# Patient Record
Sex: Female | Born: 1955
Health system: Southern US, Community
[De-identification: ages and names within clinical notes are randomized; demographics above are authoritative.]

## PROBLEM LIST (undated history)

## (undated) DIAGNOSIS — B829 Intestinal parasitism, unspecified: Secondary | ICD-10-CM

## (undated) DIAGNOSIS — S0300XA Dislocation of jaw, unspecified side, initial encounter: Secondary | ICD-10-CM

## (undated) HISTORY — PX: TUBAL LIGATION: SHX77

---

## 2017-03-31 ENCOUNTER — Emergency Department: Payer: 59

## 2017-03-31 ENCOUNTER — Emergency Department
Admission: EM | Admit: 2017-03-31 | Discharge: 2017-03-31 | Disposition: A | Discharge status: Home / Self Care | Payer: 59 | Attending: Emergency Medicine | Admitting: Emergency Medicine

## 2017-03-31 ENCOUNTER — Encounter: Payer: Self-pay | Admitting: Emergency Medicine

## 2017-03-31 DIAGNOSIS — Y9389 Activity, other specified: Secondary | ICD-10-CM | POA: Diagnosis not present

## 2017-03-31 DIAGNOSIS — M26629 Arthralgia of temporomandibular joint, unspecified side: Secondary | ICD-10-CM

## 2017-03-31 DIAGNOSIS — Y929 Unspecified place or not applicable: Secondary | ICD-10-CM | POA: Insufficient documentation

## 2017-03-31 DIAGNOSIS — Y998 Other external cause status: Secondary | ICD-10-CM | POA: Insufficient documentation

## 2017-03-31 DIAGNOSIS — R6884 Jaw pain: Secondary | ICD-10-CM | POA: Insufficient documentation

## 2017-03-31 DIAGNOSIS — S0340XA Sprain of jaw, unspecified side, initial encounter: Secondary | ICD-10-CM | POA: Diagnosis not present

## 2017-03-31 DIAGNOSIS — S0993XA Unspecified injury of face, initial encounter: Secondary | ICD-10-CM | POA: Diagnosis present

## 2017-03-31 DIAGNOSIS — X58XXXA Exposure to other specified factors, initial encounter: Secondary | ICD-10-CM | POA: Diagnosis not present

## 2017-03-31 MED ORDER — CYCLOBENZAPRINE HCL 10 MG PO TABS: 10.0000 mg | ORAL_TABLET | Freq: Three times a day (TID) | ORAL | 0 refills | Status: DC | PRN

## 2017-03-31 MED ORDER — KETOROLAC TROMETHAMINE 10 MG PO TABS: 10.0000 mg | ORAL_TABLET | Freq: Four times a day (QID) | ORAL | 0 refills | Status: AC | PRN

## 2017-03-31 MED ORDER — KETOROLAC TROMETHAMINE 30 MG/ML IJ SOLN
30.0000 mg | Freq: Once | INTRAMUSCULAR | Status: AC
  Administered 2017-03-31: 30 mg via INTRAMUSCULAR
  Filled 2017-03-31: qty 1

## 2017-03-31 MED ORDER — DIAZEPAM 5 MG PO TABS
5.0000 mg | ORAL_TABLET | Freq: Once | ORAL | Status: AC
  Administered 2017-03-31: 5 mg via ORAL
  Filled 2017-03-31: qty 1

## 2017-03-31 NOTE — ED Notes (Signed)
Upon assessment pt reports pain to jaw after biting jaw breaker.

## 2017-03-31 NOTE — Discharge Instructions (Signed)
Follow-up with her dentist is a history unable to schedule an appointment. Inquire about a referral to a TMJ specialist. Take medication as prescribed. If symptoms worsen do not hesitate to return to emergency department.

## 2017-03-31 NOTE — ED Provider Notes (Signed)
[facility] Emergency Department Provider Note   ____________________________________________   I have reviewed the triage vital signs and the nursing notes.   HISTORY  Chief Complaint Jaw Pain    HPI Lauren Pennington is a 61 y.o. female present or department with right TMJ pain after biting through a hard piece of candy yesterday. Patient noted while biting to the candy an audible pop and since of subluxation of the TMJ joint. Since the incident she is experience severe TMJ joint pain, popping and sense of instability of the right TMJ joint. Patient has been unable to chew and bite food since the incident. Patient reports pain radiates into the ear over the right eye and along the right temporal area of the scalp. Patient reports a history of TMJ issues that have been minor and intermittent in the past. Patient denies fever, chills, headache, vision changes, chest pain, chest tightness, shortness of breath, abdominal pain, nausea and vomiting.  History reviewed. No pertinent past medical history.  There are no active problems to display for this patient.   History reviewed. No pertinent surgical history.  Prior to Admission medications   Medication Sig Start Date End Date Taking? Authorizing Provider  cyclobenzaprine (FLEXERIL) 10 MG tablet Take 1 tablet (10 mg total) by mouth 3 (three) times daily as needed for muscle spasms. 03/31/17   Little, Traci M, PA-C  ketorolac (TORADOL) 10 MG tablet Take 1 tablet (10 mg total) by mouth every 6 (six) hours as needed. 03/31/17 04/05/17  Little, Jordan Likes, PA-C    Allergies Patient has no known allergies.  History reviewed. No pertinent family history.  Social History Social History  Substance Use Topics  . Smoking status: Never Smoker  . Smokeless tobacco: Never Used  . Alcohol use No    Review of Systems Constitutional: Negative for fever/chills Eyes: No visual changes. ENT:  Negative for sore throat and for  difficulty swallowing Cardiovascular: Denies chest pain. Respiratory: Denies cough. Denies shortness of breath. Gastrointestinal: No abdominal pain.  No nausea, vomiting, diarrhea. Genitourinary: Negative for dysuria. Musculoskeletal: Positive for TMJ pain. Skin: Negative for rash. Neurological: Negative for headaches.  Negative focal weakness or numbness. Negative for loss of consciousness. Able to ambulate. ____________________________________________   PHYSICAL EXAM:  VITAL SIGNS: ED Triage Vitals  Enc Vitals Group     BP 03/31/17 2004 114/71     Pulse Rate 03/31/17 2004 74     Resp 03/31/17 2004 18     Temp 03/31/17 2004 98 F (36.7 C)     Temp Source 03/31/17 2004 Oral     SpO2 03/31/17 2004 98 %     Weight 03/31/17 2005 13 lb (5.897 kg)     Height 03/31/17 2005  (1.626 m)     Head Circumference --      Peak Flow --      Pain Score 03/31/17 2004 10     Pain Loc --      Pain Edu? --      Excl. in GC? --     Constitutional: Alert and oriented. Well appearing and in no acute distress.  Eyes: Conjunctivae are normal. PERRL. EOMI  Head: Normocephalic and atraumatic. ENT:      Ears: Canals clear. TMs intact bilaterally.      Nose: No congestion/rhinnorhea.      Mouth/Throat: Mucous membranes are moist.  Neck:Supple. No thyromegaly. No stridor.  Cardiovascular: Normal rate, regular rhythm. Normal S1 and S2.  Good peripheral circulation.  Respiratory: Normal respiratory effort without tachypnea or retractions. Lungs CTAB. No wheezes/rales/rhonchi. Good air entry to the bases with no decreased or absent breath sounds. Hematological/Lymphatic/Immunological: No cervical lymphadenopathy. Cardiovascular: Normal rate, regular rhythm. Normal distal pulses. Gastrointestinal: Bowel sounds 4 quadrants. Soft and nontender to palpation.  Musculoskeletal: Right side TMJ pain and palpable popping. Pain with maximal opening and closing. Mandibular deviation with popping/crepitus over  the right TMJ. Unable to fully clench with occlusion. Palpable muscle tension and tenderness over the right temporalis, masseter. Nontender with normal range of motion in all extremities. Neurologic: Normal speech and language. No gross focal neurologic deficits are appreciated. No gait instability. Cranial nerves: II-X intact. No sensory loss or abnormal reflexes.  Skin:  Skin is warm, dry and intact. No rash noted. Psychiatric: Mood and affect are normal. Speech and behavior are normal. Patient exhibits appropriate insight and judgement.  ____________________________________________   LABS (all labs ordered are listed, but only abnormal results are displayed)  Labs Reviewed - No data to display ____________________________________________  EKG none ____________________________________________  RADIOLOGY DG TMJ Open & Close Unilateral Right FINDINGS: Technically limited examination due to overlapping bone structures. No evidence of dislocation or displacement of the temporomandibular joints on the AP view. Open and closed mouth images of the right temporomandibular joint demonstrate minimal motion of the mandibular head with respect to the joint space. No focal bone lesion or bone destruction is appreciated.  IMPRESSION: Limited study. No evidence of dislocation of the right temporomandibular joint. Limited motion is demonstrated between open and closed-mouth views. This is nonspecific in could be due to positioning, muscle spasm, or joint dysfunction.  Imaging review, no acute fracture or dislocation noted per report.  ____________________________________________   PROCEDURES  Procedure(s) performed: no    Critical Care performed: no ____________________________________________   INITIAL IMPRESSION / ASSESSMENT AND PLAN / ED COURSE  Pertinent labs & imaging results that were available during my care of the patient were reviewed by me and considered in my medical  decision making (see chart for details).  Patient presents to emergency department with right-sided TMJ pain.Marland Kitchen History, physical exam findings, and imaging are reassuring symptoms are consistent with right side TMJ sprain. Patient responded well Toradol and Valium given during the course of care in the emergency department. Patient will be prescribed 5 day course of Toradol for pain and inflammation and flexeril for muscle spasms as needed. Patient advised to follow up with her dentist and/or TMJ specialist or return to the emergency department if symptoms return or worsen. Patient informed of clinical course, understand medical decision-making process, and agree with plan.  ____________________________________________   FINAL CLINICAL IMPRESSION(S) / ED DIAGNOSES  Final diagnoses:  Temporomandibular joint (TMJ) pain  TMJ (sprain of temporomandibular joint), initial encounter       NEW MEDICATIONS STARTED DURING THIS VISIT:  Discharge Medication List as of 03/31/2017 10:55 PM    START taking these medications   Details  cyclobenzaprine (FLEXERIL) 10 MG tablet Take 1 tablet (10 mg total) by mouth 3 (three) times daily as needed for muscle spasms., Starting Wed 03/31/2017, Print    ketorolac (TORADOL) 10 MG tablet Take 1 tablet (10 mg total) by mouth every 6 (six) hours as needed., Starting Wed 03/31/2017, Until Mon 04/05/2017, Print         Note:  This document was prepared using Dragon voice recognition software and may include unintentional dictation errors.    Little, Karl Pock 04/01/17 0020    Merrily Brittle, MD 04/02/17  1059  

## 2017-03-31 NOTE — ED Triage Notes (Signed)
Pt presents to ED with c/o right sided jaw pain after biting a gobstopper last night. Pt reports right sided jaw tenderness with radiating pain into her right ear and right eye. Pt is worried she popped her jaw out of place and states she is able to feel popping when she puts her hand over her jaw when trying to eat or open her mouth.

## 2017-05-19 ENCOUNTER — Emergency Department
Admission: EM | Admit: 2017-05-19 | Discharge: 2017-05-19 | Disposition: A | Discharge status: Home / Self Care | Payer: 59 | Attending: Emergency Medicine | Admitting: Emergency Medicine

## 2017-05-19 ENCOUNTER — Encounter: Payer: Self-pay | Admitting: *Deleted

## 2017-05-19 DIAGNOSIS — H9201 Otalgia, right ear: Secondary | ICD-10-CM | POA: Insufficient documentation

## 2017-05-19 DIAGNOSIS — Z79899 Other long term (current) drug therapy: Secondary | ICD-10-CM | POA: Insufficient documentation

## 2017-05-19 DIAGNOSIS — H6121 Impacted cerumen, right ear: Secondary | ICD-10-CM | POA: Insufficient documentation

## 2017-05-19 MED ORDER — DEXAMETHASONE SODIUM PHOSPHATE 10 MG/ML IJ SOLN
10.0000 mg | Freq: Once | INTRAMUSCULAR | Status: AC
  Administered 2017-05-19: 10 mg via INTRAMUSCULAR
  Filled 2017-05-19: qty 1

## 2017-05-19 MED ORDER — CARBAMIDE PEROXIDE 6.5 % OT SOLN: 5.0000 [drp] | Freq: Two times a day (BID) | OTIC | 0 refills | Status: DC

## 2017-05-19 NOTE — ED Provider Notes (Signed)
[facility] Emergency Department Provider Note   ____________________________________________   First MD Initiated Contact with Patient 05/19/17 1452     (approximate)  I have reviewed the triage vital signs and the nursing notes.   HISTORY  Chief Complaint Otalgia    HPI Lauren Pennington is a 61 y.o. female patient complaining of one month of right ear fullness/pain and decreased hearing. Patient denies nasal congestion or other URI signs symptoms. Patient also complaining of mild right jaw pain secondary to a TMJ condition.Patient rates her pain discomfort as a 10 over 10. No palliative measures for complaint.   History reviewed. No pertinent past medical history.  There are no active problems to display for this patient.   History reviewed. No pertinent surgical history.  Prior to Admission medications   Medication Sig Start Date End Date Taking? Authorizing Provider  carbamide peroxide (DEBROX) 6.5 % OTIC solution Place 5 drops 2 (two) times daily into the right ear. 05/19/17   Joni ReiningSmith, Ronald K, PA-C  cyclobenzaprine (FLEXERIL) 10 MG tablet Take 1 tablet (10 mg total) by mouth 3 (three) times daily as needed for muscle spasms. 03/31/17   Little, Traci M, PA-C    Allergies Patient has no known allergies.  History reviewed. No pertinent family history.  Social History Social History   Tobacco Use  . Smoking status: Never Smoker  . Smokeless tobacco: Never Used  Substance Use Topics  . Alcohol use: No  . Drug use: No    Review of Systems Constitutional: No fever/chills Eyes: No visual changes. ENT: No sore throat. Right ear pain. Mild right jaw pain Cardiovascular: Denies chest pain. Respiratory: Denies shortness of breath. Gastrointestinal: No abdominal pain.  No nausea, no vomiting.  No diarrhea.  No constipation. Genitourinary: Negative for dysuria. Musculoskeletal: Negative for back pain. Skin: Negative for rash. Neurological:  Negative for headaches, focal weakness or numbness.   ____________________________________________   PHYSICAL EXAM:  VITAL SIGNS: ED Triage Vitals [05/19/17 1437]  Enc Vitals Group     BP 119/79     Pulse Rate 96     Resp 18     Temp 97.8 F (36.6 C)     Temp Source Oral     SpO2 97 %     Weight 129 lb (58.5 kg)     Height 5\' 4"  (1.626 m)     Head Circumference      Peak Flow      Pain Score 10     Pain Loc      Pain Edu?      Excl. in GC?     Constitutional: Alert and oriented. Well appearing and in no acute distress. Eyes: Conjunctivae are normal. PERRL. EOMI. Head: Atraumatic. Nose: No congestion/rhinnorhea. EARS: Impaction right ear Mouth/Throat: Mucous membranes are moist.  Oropharynx non-erythematous. Neck: No stridor.   Hematological/Lymphatic/Immunilogical: No cervical lymphadenopathy. Cardiovascular: Normal rate, regular rhythm. Grossly normal heart sounds.  Good peripheral circulation. Respiratory: Normal respiratory effort.  No retractions. Lungs CTAB. Neurologic:  Normal speech and language. No gross focal neurologic deficits are appreciated. No gait instability. Skin:  Skin is warm, dry and intact. No rash noted. Psychiatric: Mood and affect are normal. Speech and behavior are normal.  ____________________________________________   LABS (all labs ordered are listed, but only abnormal results are displayed)  Labs Reviewed - No data to display ____________________________________________  EKG   ____________________________________________  RADIOLOGY  No results found.  ____________________________________________   PROCEDURES  Procedure(s) performed:  Procedures  Critical Care performed: No  ____________________________________________   INITIAL IMPRESSION / ASSESSMENT AND PLAN / ED COURSE  As part of my medical decision making, I reviewed the following data within the electronic MEDICAL RECORD NUMBER    Patient presented right ear  pain and decreased hearing secondary to wax pole. Patient here was irrigated using hydroperoxide and warm saline. Status post irrigation patient reports hearing has returned. Patient get a prescription for earwax softener and advised to follow-up with PCP if condition recurs.      ____________________________________________   FINAL CLINICAL IMPRESSION(S) / ED DIAGNOSES  Final diagnoses:  Impacted cerumen of right ear  Otalgia of right ear     ED Discharge Orders        Ordered    carbamide peroxide (DEBROX) 6.5 % OTIC solution  2 times daily     05/19/17 1538       Note:  This document was prepared using Dragon voice recognition software and may include unintentional dictation errors.    Joni ReiningSmith, Ronald K, PA-C 05/19/17 1544    Emily FilbertWilliams, Jonathan E, MD 05/20/17 [phone]

## 2017-05-19 NOTE — ED Notes (Signed)
Pt was seen in ED 4-5 weeks ago and dx with TMJ - pt then went to dentist and was dx with TMJ - about 3 weeks ago she yawned and her jaw popped and since then she has a "screaching" noise in her right ear with occ pain

## 2017-05-19 NOTE — ED Triage Notes (Signed)
States right ear pain and fullness, awake and alert in no acute distress

## 2017-07-01 ENCOUNTER — Emergency Department: Payer: 59

## 2017-07-01 ENCOUNTER — Emergency Department
Admission: EM | Admit: 2017-07-01 | Discharge: 2017-07-01 | Disposition: A | Discharge status: Home / Self Care | Payer: 59 | Attending: Emergency Medicine | Admitting: Emergency Medicine

## 2017-07-01 ENCOUNTER — Other Ambulatory Visit: Payer: Self-pay

## 2017-07-01 DIAGNOSIS — M5412 Radiculopathy, cervical region: Secondary | ICD-10-CM | POA: Insufficient documentation

## 2017-07-01 DIAGNOSIS — M79601 Pain in right arm: Secondary | ICD-10-CM | POA: Diagnosis present

## 2017-07-01 DIAGNOSIS — Z79899 Other long term (current) drug therapy: Secondary | ICD-10-CM | POA: Diagnosis not present

## 2017-07-01 LAB — CBC
HEMATOCRIT: 41.4 % (ref 35.0–47.0)
HEMOGLOBIN: 14.2 g/dL (ref 12.0–16.0)
MCH: 31.4 pg (ref 26.0–34.0)
MCHC: 34.3 g/dL (ref 32.0–36.0)
MCV: 91.6 fL (ref 80.0–100.0)
Platelets: 255 10*3/uL (ref 150–440)
RBC: 4.52 MIL/uL (ref 3.80–5.20)
RDW: 13.4 % (ref 11.5–14.5)
WBC: 7.8 10*3/uL (ref 3.6–11.0)

## 2017-07-01 LAB — BASIC METABOLIC PANEL
ANION GAP: 9 (ref 5–15)
BUN: 12 mg/dL (ref 6–20)
CO2: 26 mmol/L (ref 22–32)
Calcium: 9.4 mg/dL (ref 8.9–10.3)
Chloride: 102 mmol/L (ref 101–111)
Creatinine, Ser: 0.57 mg/dL (ref 0.44–1.00)
GFR calc Af Amer: >60 mL/min (ref >60)
Glucose, Bld: 89 mg/dL (ref 65–99)
POTASSIUM: 4 mmol/L (ref 3.5–5.1)
SODIUM: 137 mmol/L (ref 135–145)

## 2017-07-01 LAB — TROPONIN I: Troponin I: 0.03 ng/mL (ref <0.03)

## 2017-07-01 MED ORDER — IBUPROFEN 600 MG PO TABS
600.0000 mg | ORAL_TABLET | Freq: Once | ORAL | Status: AC
  Administered 2017-07-01: 600 mg via ORAL
  Filled 2017-07-01: qty 1

## 2017-07-01 MED ORDER — PREDNISONE 20 MG PO TABS: ORAL_TABLET | ORAL | 0 refills | Status: AC

## 2017-07-01 MED ORDER — PREDNISONE 20 MG PO TABS
60.0000 mg | ORAL_TABLET | Freq: Once | ORAL | Status: AC
  Administered 2017-07-01: 60 mg via ORAL
  Filled 2017-07-01: qty 3

## 2017-07-01 MED ORDER — OXYCODONE-ACETAMINOPHEN 5-325 MG PO TABS: 1.0000 | ORAL_TABLET | Freq: Four times a day (QID) | ORAL | 0 refills | Status: AC | PRN

## 2017-07-01 MED ORDER — OXYCODONE-ACETAMINOPHEN 5-325 MG PO TABS
1.0000 | ORAL_TABLET | Freq: Once | ORAL | Status: AC
  Administered 2017-07-01: 1 via ORAL
  Filled 2017-07-01: qty 1

## 2017-07-01 NOTE — ED Notes (Signed)
ED Provider at bedside. 

## 2017-07-01 NOTE — ED Triage Notes (Signed)
Pt states she thinks she has a pinched nerve. States CP, R arm pain, and upper back pain. States symptoms began Sunday. Alert oriented ambulatory. States nausea. States she recently started on naprosyn for pain.

## 2017-07-01 NOTE — Discharge Instructions (Signed)
You have a bulging disc in your lower neck at the T1 level which is likely causing most of your symptoms.  Continue to take the Naprosyn as prescribed, and you can add on the Percocet for more severe pain.  You should also start the prednisone at home tomorrow, and continue as prescribed, finishing the full course.  Return to the emergency department for new or worsening pain, new or worsening weakness or numbness, weakness or numbness spreading to other areas, drooping in your face, confusion or change in mental status, vision changes, or any other new or worsening symptoms that concern you.  You can follow-up with the neurosurgeon in 1-2 weeks.

## 2017-07-01 NOTE — ED Notes (Signed)
Report off to allison rn  

## 2017-07-01 NOTE — ED Provider Notes (Addendum)
[facility] Emergency Department Provider Note ____________________________________________   First MD Initiated Contact with Patient 07/01/17 1507     (approximate)  I have reviewed the triage vital signs and the nursing notes.   HISTORY  Chief Complaint Chest Pain and Arm Pain (right )    HPI Lauren Pennington is a 61 y.o. female with past medical history as noted below who presents with primarily right arm pain over the last 5 days, relatively acute onset, ranging from the neck to the right upper back, into the right chest, and down the right arm, and associated with numbness to her inner arm and intermittent tingling.  Patient states it is aggravated by lifting and carrying things.  She states she has decreased grip strength on the right side.  Patient denies any prior history of this symptom.  She denies any headache, or any numbness or weakness in other parts of her body.  She states that she was seen 4 days ago in the emergency department at [facility] and told she had a pinched nerve.  She states she has been on Naprosyn and Flexeril without relief.  History reviewed. No pertinent past medical history.  There are no active problems to display for this patient.   Past Surgical History:  Procedure Laterality Date  . TUBAL LIGATION      Prior to Admission medications   Medication Sig Start Date End Date Taking? Authorizing Provider  carbamide peroxide (DEBROX) 6.5 % OTIC solution Place 5 drops 2 (two) times daily into the right ear. 05/19/17  Yes Joni Reining, PA-C  cyclobenzaprine (FLEXERIL) 10 MG tablet Take 1 tablet (10 mg total) by mouth 3 (three) times daily as needed for muscle spasms. 03/31/17  Yes Little, Traci M, PA-C  ibuprofen (ADVIL,MOTRIN) 800 MG tablet Take 1 tablet by mouth 3 (three) times daily. 04/16/17  Yes [provider]  naproxen (NAPROSYN) 500 MG tablet Take 1 tablet by mouth 2 (two) times daily. 06/28/17  Yes [provider]  oxyCODONE-acetaminophen (ROXICET) 5-325 MG tablet Take 1 tablet by mouth every 6 (six) hours as needed for up to 5 days for severe pain. 07/01/17 07/06/17  Dionne Bucy, MD  predniSONE (DELTASONE) 20 MG tablet Take 3 tablets (60 mg total) by mouth daily with breakfast for 5 days, THEN 2 tablets (40 mg total) daily with breakfast for 3 days, THEN 1 tablet (20 mg total) daily with breakfast for 3 days. 07/02/17 07/13/17  Dionne Bucy, MD    Allergies Patient has no known allergies.  History reviewed. No pertinent family history.  Social History Social History   Tobacco Use  . Smoking status: Never Smoker  . Smokeless tobacco: Never Used  Substance Use Topics  . Alcohol use: Yes  . Drug use: No    Review of Systems  Constitutional: No fever. Eyes: No visual changes. ENT: Positive for neck pain. Cardiovascular: Positive for chest pain. Respiratory: Denies shortness of breath. Gastrointestinal: No nausea, no vomiting.  Genitourinary: Negative for dysuria.  Musculoskeletal: Positive for right upper back pain. Skin: Negative for rash. Neurological: Negative for headache.   ____________________________________________   PHYSICAL EXAM:  VITAL SIGNS: ED Triage Vitals  Enc Vitals Group     BP 07/01/17 1228 134/84     Pulse Rate 07/01/17 1228 92     Resp 07/01/17 1228 18     Temp 07/01/17 1228 97.8 F (36.6 C)     Temp src --      SpO2 07/01/17  1228 100 %     Weight 07/01/17 1229 128 lb (58.1 kg)     Height 07/01/17 1229 5\' 4"  (1.626 m)     Head Circumference --      Peak Flow --      Pain Score 07/01/17 1227 10     Pain Loc --      Pain Edu? --      Excl. in GC? --     Constitutional: Alert and oriented. Well appearing and in no acute distress. Eyes: Conjunctivae are normal.  EOMI.  PERRLA. Head: Atraumatic. Nose: No congestion/rhinnorhea. Mouth/Throat: Mucous membranes are moist.   Neck: Normal range of motion.  Mild right paraspinal tenderness.   No midline C-spine tenderness. Cardiovascular: Good peripheral circulation. Respiratory: Normal respiratory effort.  No retractions.  Gastrointestinal: No distention.  Genitourinary: No CVA tenderness. Musculoskeletal:  Extremities warm and well perfused.  Neurologic:  Normal speech and language.  Cranial nerves III through XII intact.  Subjective decreased sensation to right medial/volar aspect of arm, and minimally decreased right grip strength.  Otherwise 5/5 strength and intact sensation in all 4 extremities. Skin:  Skin is warm and dry. No rash noted. Psychiatric: Mood and affect are normal. Speech and behavior are normal.  ____________________________________________   LABS (all labs ordered are listed, but only abnormal results are displayed)  Labs Reviewed  TROPONIN I - Abnormal; Notable for the following components:      Result Value   Troponin I 0.03 (*)    All other components within normal limits  BASIC METABOLIC PANEL  CBC  TROPONIN I   ____________________________________________  EKG  ED ECG REPORT I, Dionne BucySebastian Annibelle Brazie, the attending physician, personally viewed and interpreted this ECG.  Date: 07/01/2017 EKG Time: 1219 Rate: 83 Rhythm: normal sinus rhythm QRS Axis: normal Intervals: normal ST/T Wave abnormalities: normal Narrative Interpretation: no evidence of acute ischemia; no old EKG available for comparison  ____________________________________________  RADIOLOGY  CXR: No acute findings MR cspine:   ____________________________________________   PROCEDURES  Procedure(s) performed: No    Critical Care performed: No ____________________________________________   INITIAL IMPRESSION / ASSESSMENT AND PLAN / ED COURSE  Pertinent labs & imaging results that were available during my care of the patient were reviewed by me and considered in my medical decision making (see chart for details).  61 year old female with past medical history as  noted above presents with pain for the last 5 days ranging from her neck to the right arm and into her upper back and right upper chest area, and associated with numbness, tingling, and decreased grip strength in the right hand.  No prior history of this symptom.  Past medical records reviewed in Epic and are noncontributory.  On exam, vital signs are normal, patient is relatively well-appearing, and the exam is significant for slightly decreased grip strength on the right side, and subjective numbness to the right medial/volar forearm.  Otherwise normal neuro exam.  Differential includes primarily radiculopathy, peripheral neuropathy, or less likely structural disease in the C-spine.  There is no clinical evidence for ACS, and given the duration of symptoms patient has been ruled out with EKG and troponin x1.  No evidence of PE or other cardiopulmonary cause.  Plan: Analgesia, MRI C-spine, and reassess.    ----------------------------------------- 6:38 PM on 07/01/2017 -----------------------------------------  MRI reveals T1-T2 disc bulging with impingement on T1 nerve root, which corresponds to patient's symptoms.  Given the patient has motor almost completely intact, and pain is now well controlled,  there is no indication for acute surgical intervention.  We will plan for discharge with course of steroid, and I will give the patient referral to neurosurgery.  Discharge instructions and return precautions explained to patient, and she expresses understanding.  Given that the patient's initial troponin was indeterminate, although her clinical picture is not consistent with ACS I would like to repeat the troponin to verify that it is not rising.  Patient agrees with staying to repeat it but we will plan for discharge if it is not significantly changed.  ----------------------------------------- 7:49 PM on 07/01/2017 -----------------------------------------  Repeat troponin is negative.  We will  discharge home.  ____________________________________________   FINAL CLINICAL IMPRESSION(S) / ED DIAGNOSES  Final diagnoses:  Radiculopathy of cervical region      NEW MEDICATIONS STARTED DURING THIS VISIT:  This SmartLink is deprecated. Use AVSMEDLIST instead to display the medication list for a patient.   Note:  This document was prepared using Dragon voice recognition software and may include unintentional dictation errors.    Dionne BucySiadecki, Marke Goodwyn, MD 07/01/17 Nobie [id number]    Dionne BucySiadecki, Cloee Dunwoody, MD 07/01/17 1949

## 2017-07-01 NOTE — ED Notes (Signed)
Return from mri  Pt alert.   

## 2017-07-01 NOTE — ED Notes (Signed)
Pt waiting on mri.  Pt alert.  nsr on monitor.

## 2017-07-01 NOTE — ED Notes (Signed)
First nurse note: Pt reports has pinched nerve in her neck and it is causing her right arm and chest to hurt. No apparent distress noted.

## 2017-07-01 NOTE — ED Notes (Signed)
Resumed care from emma rn.  Pt alert.  Pt has right arm pain and chest pain.  Pt also reports numbness in right arm.  nsr on monitor.  Skin warm and dry.  Pt alert.

## 2017-08-12 ENCOUNTER — Ambulatory Visit: Payer: 59 | Admitting: Student in an Organized Health Care Education/Training Program

## 2017-10-28 ENCOUNTER — Other Ambulatory Visit: Payer: Self-pay

## 2017-10-28 ENCOUNTER — Emergency Department
Admission: EM | Admit: 2017-10-28 | Discharge: 2017-10-28 | Disposition: A | Discharge status: Home / Self Care | Payer: 59 | Attending: Emergency Medicine | Admitting: Emergency Medicine

## 2017-10-28 DIAGNOSIS — R21 Rash and other nonspecific skin eruption: Secondary | ICD-10-CM

## 2017-10-28 MED ORDER — DEXAMETHASONE SODIUM PHOSPHATE 10 MG/ML IJ SOLN
10.0000 mg | Freq: Once | INTRAMUSCULAR | Status: AC
  Administered 2017-10-28: 10 mg via INTRAMUSCULAR
  Filled 2017-10-28: qty 1

## 2017-10-28 MED ORDER — NYSTATIN-TRIAMCINOLONE 100000-0.1 UNIT/GM-% EX OINT: 1.0000 "application " | TOPICAL_OINTMENT | Freq: Two times a day (BID) | CUTANEOUS | 0 refills | Status: DC

## 2017-10-28 MED ORDER — PREDNISONE 10 MG PO TABS: ORAL_TABLET | ORAL | 0 refills | Status: DC

## 2017-10-28 MED ORDER — RANITIDINE HCL 150 MG PO TABS: 150.0000 mg | ORAL_TABLET | Freq: Two times a day (BID) | ORAL | 0 refills | Status: DC

## 2017-10-28 NOTE — ED Triage Notes (Signed)
Pt c/o itchy rash that started on the torso yesterday. Pt  denies any difficulty breathing .

## 2017-10-28 NOTE — ED Notes (Signed)
Pt reports that the rash began yesterday after waking up from sleep. Pt states that she put on cream on rash and went to work. Pt says that as day went on and she started sweating, the rash started to get worse. Pt states that as it got worse, she would try not to scratch because it would get worse. AOx4.

## 2017-10-28 NOTE — Discharge Instructions (Signed)
Follow-up with your primary care provider or 1 of the dermatologist listed on your discharge papers.  Begin using nystatin/triamcinolone cream twice a day to the skin under your breast.  Also obtain over-the-counter Zyrtec or Claritin as both of these are antihistamines and do not cause drowsiness.  Prednisone beginning today and tapering down for the next 6 days.  Zantac 150 mg twice daily for the next 2 weeks.  This is also an histamine blocker which should decrease the amount of itching without causing drowsiness.

## 2017-10-28 NOTE — ED Provider Notes (Signed)
[facility] Emergency Department Provider Note   ____________________________________________   First MD Initiated Contact with Patient 10/28/17 1158     (approximate)  I have reviewed the triage vital signs and the nursing notes.   HISTORY  Chief Complaint Rash   HPI Lauren Pennington is a 62 y.o. female is here with complaint of rash.  Patient states that she noticed this yesterday and has gotten worse today.  Patient states that she is so uncomfortable that she had to leave work.  She denies any difficulty breathing or swallowing.  She has not take any over-the-counter medication for her rash.  She states that the more she scratches the worse the itching gets.  History reviewed. No pertinent past medical history.  There are no active problems to display for this patient.   Past Surgical History:  Procedure Laterality Date  . TUBAL LIGATION      Prior to Admission medications   Medication Sig Start Date End Date Taking? Authorizing Provider  nystatin-triamcinolone ointment (MYCOLOG) Apply 1 application topically 2 (two) times daily. 10/28/17   Tommi RumpsSummers, Izola Teague L, PA-C  predniSONE (DELTASONE) 10 MG tablet Take 6 tablets  today, on day 2 take 5 tablets, day 3 take 4 tablets, day 4 take 3 tablets, day 5 take  2 tablets and 1 tablet the last day 10/28/17   Tommi RumpsSummers, Channa Hazelett L, PA-C  ranitidine (ZANTAC) 150 MG tablet Take 1 tablet (150 mg total) by mouth 2 (two) times daily. 10/28/17 10/28/18  Tommi RumpsSummers, Agripina Guyette L, PA-C    Allergies Patient has no known allergies.  No family history on file.  Social History Social History   Tobacco Use  . Smoking status: Never Smoker  . Smokeless tobacco: Never Used  Substance Use Topics  . Alcohol use: Yes  . Drug use: No    Review of Systems Constitutional: No fever/chills Cardiovascular: Denies chest pain. Respiratory: Denies shortness of breath. Gastrointestinal:  No nausea, no vomiting.   Musculoskeletal:  Negative for back pain. Skin: Positive for rash. Neurological: Negative for headaches, focal weakness or numbness. ___________________________________________   PHYSICAL EXAM:  VITAL SIGNS: ED Triage Vitals  Enc Vitals Group     BP 10/28/17 1154 128/82     Pulse Rate 10/28/17 1154 74     Resp 10/28/17 1154 16     Temp 10/28/17 1154 97.7 F (36.5 C)     Temp Source 10/28/17 1154 Oral     SpO2 10/28/17 1154 98 %     Weight 10/28/17 1149 125 lb (56.7 kg)     Height 10/28/17 1149 5\' 5"  (1.651 m)     Head Circumference --      Peak Flow --      Pain Score 10/28/17 1149 0     Pain Loc --      Pain Edu? --      Excl. in GC? --    Constitutional: Alert and oriented. Well appearing and in no acute distress. Eyes: Conjunctivae are normal. PERRL. EOMI. Head: Atraumatic. Neck: No stridor.   Cardiovascular: Normal rate, regular rhythm. Grossly normal heart sounds.  Good peripheral circulation. Respiratory: Normal respiratory effort.  No retractions. Lungs CTAB. Musculoskeletal: Moves upper and lower extremities without any difficulty.  Normal gait was noted. Neurologic:  Normal speech and language. No gross focal neurologic deficits are appreciated.  Skin:  Skin is warm, dry and intact.  Erythematous rash is beneath both breast.  There is no drainage or vesicles noted.  There is  also multiple erythematous areas upper and lower extremity and on the torso.  Patient has more erythema after scratching in a particular area which is consistent with a histamine release. Psychiatric: Mood and affect are normal. Speech and behavior are normal.  ____________________________________________   LABS (all labs ordered are listed, but only abnormal results are displayed)  Labs Reviewed - No data to display   PROCEDURES  Procedure(s) performed: None  Procedures  Critical Care performed: No  ____________________________________________   INITIAL IMPRESSION / ASSESSMENT AND PLAN / ED  COURSE  As part of my medical decision making, I reviewed the following data within the electronic MEDICAL RECORD NUMBER Notes from prior ED visits and [place] Controlled Substance Database  Patient was given Decadron 10 mg IM while in the department.  Patient was discharged with a prescription for prednisone 60 mg 6-day taper and Zantac 150 mg twice daily for 30 days.  Patient also is to obtain over-the-counter Zyrtec or Claritin which does not cause drowsiness like Benadryl.  Patient was given a prescription for Mycolog cream to apply under the breast for the rash that she has they are.  She is to return to the emergency department over the holiday weekend if any worsening of her symptoms.  ____________________________________________   FINAL CLINICAL IMPRESSION(S) / ED DIAGNOSES  Final diagnoses:  Rash and nonspecific skin eruption     ED Discharge Orders        Ordered    ranitidine (ZANTAC) 150 MG tablet  2 times daily     10/28/17 1305    predniSONE (DELTASONE) 10 MG tablet     10/28/17 1305    nystatin-triamcinolone ointment (MYCOLOG)  2 times daily     10/28/17 1305       Note:  This document was prepared using Dragon voice recognition software and may include unintentional dictation errors.    Tommi Rumps, PA-C 10/28/17 1338    Emily Filbert, MD 10/28/17 [id number]

## 2018-02-28 ENCOUNTER — Other Ambulatory Visit: Payer: Self-pay

## 2018-02-28 ENCOUNTER — Encounter: Payer: Self-pay | Admitting: Emergency Medicine

## 2018-02-28 ENCOUNTER — Emergency Department
Admission: EM | Admit: 2018-02-28 | Discharge: 2018-02-28 | Disposition: A | Discharge status: Home / Self Care | Payer: 59 | Attending: Emergency Medicine | Admitting: Emergency Medicine

## 2018-02-28 DIAGNOSIS — H6123 Impacted cerumen, bilateral: Secondary | ICD-10-CM | POA: Diagnosis not present

## 2018-02-28 DIAGNOSIS — M26621 Arthralgia of right temporomandibular joint: Secondary | ICD-10-CM | POA: Diagnosis not present

## 2018-02-28 HISTORY — DX: Dislocation of jaw, unspecified side, initial encounter: S03.00XA

## 2018-02-28 MED ORDER — NAPROXEN 500 MG PO TABS: 500.0000 mg | ORAL_TABLET | Freq: Two times a day (BID) | ORAL | 0 refills | Status: AC

## 2018-02-28 MED ORDER — CYCLOBENZAPRINE HCL 5 MG PO TABS: 5.0000 mg | ORAL_TABLET | Freq: Three times a day (TID) | ORAL | 0 refills | Status: DC | PRN

## 2018-02-28 NOTE — ED Triage Notes (Signed)
C/O right ear and jaw pain x 2-3 weeks.  Symptoms just worsening.  Alos c/o headache.

## 2018-02-28 NOTE — ED Notes (Signed)
See triage note  Presents with right ear pain which is moving into temporal area   Hx of TMJ  Thinks the pain is coming from this  Also having lower back pain s/p fall last Monday  Ambulates well to treatment room

## 2018-02-28 NOTE — ED Provider Notes (Signed)
[facility] Emergency Department Provider Note ____________________________________________  Time seen: 1526  I have reviewed the triage vital signs and the nursing notes.  HISTORY  Chief Complaint  Otalgia and Temporomandibular Joint Pain  HPI Lauren Pennington is a 62 y.o. female presents herself to the ED for evaluation of chronic intermittent right TMJ pain.  Patient gives a history of TMJ dysfunction, and has been evaluated by both her ENT and dental providers.  She wears a dental bite guard at night, and has been doing well until about 2 to 3 weeks earlier.  She describes eating fresh cherries, when she accidentally bit 1 of the pits.  Since that time she is been having pain and discomfort to the right TMJ.  She also reports some dullness in her hearing of the right ear, as well as a mild headache.  She denies any other injury by trauma, accident, or direct contact.  Denies any dislocation or subluxation to the jaw.  She also denies any vertigo, tightness, or nausea.  Patient has been taking Aleve over-the-counter with some benefit.  She has been referred in the past to [place] [facility], but does not have the co-pay to for the initial evaluation.  Past Medical History:  Diagnosis Date  . TMJ (dislocation of temporomandibular joint)     There are no active problems to display for this patient.   Past Surgical History:  Procedure Laterality Date  . TUBAL LIGATION      Prior to Admission medications   Medication Sig Start Date End Date Taking? Authorizing Provider  cyclobenzaprine (FLEXERIL) 5 MG tablet Take 1 tablet (5 mg total) by mouth 3 (three) times daily as needed for muscle spasms. 02/28/18   Girolamo Lortie, Charlesetta IvoryJenise V Bacon, PA-C  naproxen (NAPROSYN) 500 MG tablet Take 1 tablet (500 mg total) by mouth 2 (two) times daily with a meal for 15 days. 02/28/18 03/15/18  Reuben Knoblock, Charlesetta IvoryJenise V Bacon, PA-C    Allergies Patient has no known allergies.  No family  history on file.  Social History Social History   Tobacco Use  . Smoking status: Never Smoker  . Smokeless tobacco: Never Used  Substance Use Topics  . Alcohol use: Yes  . Drug use: No    Review of Systems  Constitutional: Negative for fever. Eyes: Negative for visual changes. ENT: Negative for sore throat.  Right otalgia and right TMJ pain as above. Cardiovascular: Negative for chest pain. Respiratory: Negative for shortness of breath. Gastrointestinal: Negative for abdominal pain, vomiting and diarrhea. Musculoskeletal: Negative for back pain. Skin: Negative for rash. Neurological: Negative for headaches, focal weakness or numbness. ____________________________________________  PHYSICAL EXAM:  VITAL SIGNS: ED Triage Vitals  Enc Vitals Group     BP 02/28/18 1510 125/83     Pulse Rate 02/28/18 1510 74     Resp 02/28/18 1510 20     Temp 02/28/18 1510 (!) 97.5 F (36.4 C)     Temp Source 02/28/18 1510 Oral     SpO2 02/28/18 1510 98 %     Weight 02/28/18 1441 125 lb (56.7 kg)     Height 02/28/18 1510 5\' 4"  (1.626 m)     Head Circumference --      Peak Flow --      Pain Score 02/28/18 1441 10     Pain Loc --      Pain Edu? --      Excl. in GC? --     Constitutional: Alert and  oriented. Well appearing and in no distress. Head: Normocephalic and atraumatic. Eyes: Conjunctivae are normal. PERRL. Normal extraocular movements Ears: Canals without edema or erythema. TMs are obscured bilaterally, by soft cerumen. Mouth/Throat: Mucous membranes are moist. Cardiovascular: Normal rate, regular rhythm. Normal distal pulses. Respiratory: Normal respiratory effort. Musculoskeletal: Normal function to the TMJ without clicking, catching, or locking.  Nontender with normal range of motion in all extremities.  Neurologic:  Normal gait without ataxia. Normal speech and language. No gross focal neurologic deficits are appreciated. Skin:  Skin is warm, dry and intact. No rash  noted. Psychiatric: Mood and affect are normal. Patient exhibits appropriate insight and judgment. ____________________________________________  INITIAL IMPRESSION / ASSESSMENT AND PLAN / ED COURSE  Patient with ED evaluation of chronic right TMJ dysfunction.  Her exam is overall benign, but she does have bilateral cerumen impaction to the ears.  She will be discharged with a prescription for Flexeril as well as naproxen.  She is also given instructions on using over-the-counter Debrox or hydrogen peroxide water mixture to cleanse the ears.  She is advised to follow-up with a [place] specialist [place] [facility] for definitive management.  It is also suggested that the patient select a primary care provider for routine medical care. ____________________________________________  FINAL CLINICAL IMPRESSION(S) / ED DIAGNOSES  Final diagnoses:  Arthralgia of right temporomandibular joint  Bilateral impacted cerumen      Karmen StabsMenshew, Charlesetta IvoryJenise V Bacon, PA-C 02/28/18 1748    Sharyn CreamerQuale, Mark, MD 03/01/18 1040

## 2018-02-28 NOTE — Discharge Instructions (Addendum)
You exam is consistent with TMJ dysfunction. Avoid excessive chewing or hard foods. Take the prescription anti-inflammatory and muscle relaxant as directed. Follow-up with the TMJ specialist as planned.

## 2018-03-16 ENCOUNTER — Other Ambulatory Visit: Payer: Self-pay

## 2018-03-16 ENCOUNTER — Ambulatory Visit (INDEPENDENT_AMBULATORY_CARE_PROVIDER_SITE_OTHER): Payer: 59

## 2018-03-16 ENCOUNTER — Ambulatory Visit
Admission: EM | Admit: 2018-03-16 | Discharge: 2018-03-16 | Disposition: A | Discharge status: Home / Self Care | Payer: 59 | Attending: Family Medicine | Admitting: Family Medicine

## 2018-03-16 DIAGNOSIS — M25512 Pain in left shoulder: Secondary | ICD-10-CM | POA: Diagnosis not present

## 2018-03-16 DIAGNOSIS — W010XXA Fall on same level from slipping, tripping and stumbling without subsequent striking against object, initial encounter: Secondary | ICD-10-CM | POA: Diagnosis not present

## 2018-03-16 DIAGNOSIS — M25561 Pain in right knee: Secondary | ICD-10-CM

## 2018-03-16 MED ORDER — MELOXICAM 15 MG PO TABS: 15.0000 mg | ORAL_TABLET | Freq: Every day | ORAL | 0 refills | Status: DC | PRN

## 2018-03-16 NOTE — Discharge Instructions (Signed)
Rest, ice, elevation.  Use the medication as needed.   We will call with the xray results.  Take care  Dr. Adriana Simas

## 2018-03-16 NOTE — ED Provider Notes (Signed)
[facility]    CSN: [id number] Arrival date & time: 03/16/18  0909  History   Chief Complaint Chief Complaint  Patient presents with  . Fall  . Knee Pain    right  . Shoulder Pain    left    HPI  62 year old female presents with the above complaints.  States she fell 2 weeks ago while she was at work [employer]).  She states that she slipped on some water.  She fell and landed on her buttock.  In doing so she states that she landed on her left shoulder.  Patient states that she is been doing okay until this past week when she developed right knee pain and swelling.  She is also had left shoulder pain.  Moderate in severity.  Worse with behind back activity.  No relieving factors.  No other associated symptoms.  No other complaints.  PMH, Surgical Hx, Family Hx, Social History reviewed and updated as below.  Past Medical History:  Diagnosis Date  . TMJ (dislocation of temporomandibular joint)   Thoracic radiculopathy  Past Surgical History:  Procedure Laterality Date  . TUBAL LIGATION      OB History   None     Home Medications    Prior to Admission medications   Medication Sig Start Date End Date Taking? Authorizing Provider  cyclobenzaprine (FLEXERIL) 5 MG tablet Take 1 tablet (5 mg total) by mouth 3 (three) times daily as needed for muscle spasms. 02/28/18   Menshew, Charlesetta Ivory, PA-C  meloxicam (MOBIC) 15 MG tablet Take 1 tablet (15 mg total) by mouth daily as needed. 03/16/18   Tommie Sams, DO    Family History Family History  Problem Relation Age of Onset  . Heart attack Mother     Social History Social History   Tobacco Use  . Smoking status: Never Smoker  . Smokeless tobacco: Never Used  Substance Use Topics  . Alcohol use: Not Currently  . Drug use: No     Allergies   Patient has no known allergies.   Review of Systems Review of Systems  Constitutional: Negative.   Musculoskeletal:       Right knee pain; Left shoulder  pain.   Physical Exam Triage Vital Signs ED Triage Vitals  Enc Vitals Group     BP 03/16/18 0924 125/88     Pulse Rate 03/16/18 0924 77     Resp 03/16/18 0924 18     Temp 03/16/18 0924 98 F (36.7 C)     Temp Source 03/16/18 0924 Oral     SpO2 03/16/18 0924 100 %     Weight 03/16/18 0921 138 lb (62.6 kg)     Height 03/16/18 0921 5\' 4"  (1.626 m)     Head Circumference --      Peak Flow --      Pain Score 03/16/18 0921 10     Pain Loc --      Pain Edu? --      Excl. in GC? --    Updated Vital Signs BP 125/88 (BP Location: Left Arm)   Pulse 77   Temp 98 F (36.7 C) (Oral)   Resp 18   Ht 5\' 4"  (1.626 m)   Wt 62.6 kg   SpO2 100%   BMI 23.69 kg/m   Visual Acuity Right Eye Distance:   Left Eye Distance:   Bilateral Distance:    Right Eye Near:   Left Eye Near:  Bilateral Near:     Physical Exam  Constitutional: She is oriented to person, place, and time. She appears well-developed. No distress.  Cardiovascular: Normal rate and regular rhythm.  Pulmonary/Chest: Effort normal and breath sounds normal.  Musculoskeletal:  Shoulder: left Inspection reveals no abnormalities, atrophy or asymmetry. Palpation with mild tenderness over the bicipital groove. Rotator cuff strength normal throughout. + Hawkins.  Right knee: Effusion noted. Ligaments intact. Tender to palpation of the lateral joint line.  Neurological: She is alert and oriented to person, place, and time.  Psychiatric: She has a normal mood and affect. Her behavior is normal.  Nursing note and vitals reviewed.  UC Treatments / Results  Labs (all labs ordered are listed, but only abnormal results are displayed) Labs Reviewed - No data to display  EKG None  Radiology No results found.  Procedures Procedures (including critical care time)  Medications Ordered in UC Medications - No data to display  Initial Impression / Assessment and Plan / UC Course  I have reviewed the triage vital signs and  the nursing notes.  Pertinent labs & imaging results that were available during my care of the patient were reviewed by me and considered in my medical decision making (see chart for details).    62 year old female presents with right knee pain and left shoulder pain.  Awaiting x-ray results of right knee.  There has been some technical difficulties with processing of images and sending it to radiology.  I looked at it briefly with the radiology tech and did not see an acute fracture.  Regarding her left shoulder, this is likely bursitis.  Meloxicam as directed.  Rest, ice. Supportive care.  Final Clinical Impressions(s) / UC Diagnoses   Final diagnoses:  Acute pain of right knee  Acute pain of left shoulder     Discharge Instructions     Rest, ice, elevation.  Use the medication as needed.   We will call with the xray results.  Take care  Dr. Adriana Simas     ED Prescriptions    Medication Sig Dispense Auth. Provider   meloxicam (MOBIC) 15 MG tablet Take 1 tablet (15 mg total) by mouth daily as needed. 30 tablet Tommie Sams, DO     Controlled Substance Prescriptions [place] Controlled Substance Registry consulted? Not Applicable   Tommie Sams, DO 03/16/18 1056

## 2018-03-16 NOTE — ED Triage Notes (Signed)
Patient complains of a fall that occurred while she was at work 2 weeks ago. Patient states that she slipped on water and tried to catch herself. Patient states that her leg twisted and she landed on her left shoulder. Patient states that she did not have pain at first but has now started to have right knee swelling and pain with moving shoulder.

## 2018-03-21 ENCOUNTER — Telehealth (HOSPITAL_COMMUNITY): Payer: Self-pay

## 2018-03-21 NOTE — Telephone Encounter (Signed)
Attempted to reach patient to advise to follow up with ortho for continued symptoms for possible MRI. No answer and no voicemail available.

## 2018-03-22 ENCOUNTER — Telehealth (HOSPITAL_COMMUNITY): Payer: Self-pay

## 2018-03-22 NOTE — Telephone Encounter (Signed)
No answer x2 

## 2018-04-21 ENCOUNTER — Emergency Department: Payer: 59

## 2018-04-21 ENCOUNTER — Emergency Department
Admission: EM | Admit: 2018-04-21 | Discharge: 2018-04-21 | Disposition: A | Discharge status: Home / Self Care | Payer: 59 | Attending: Emergency Medicine | Admitting: Emergency Medicine

## 2018-04-21 ENCOUNTER — Encounter: Payer: Self-pay | Admitting: Emergency Medicine

## 2018-04-21 DIAGNOSIS — M7582 Other shoulder lesions, left shoulder: Secondary | ICD-10-CM

## 2018-04-21 DIAGNOSIS — Y999 Unspecified external cause status: Secondary | ICD-10-CM | POA: Diagnosis not present

## 2018-04-21 DIAGNOSIS — M70812 Other soft tissue disorders related to use, overuse and pressure, left shoulder: Secondary | ICD-10-CM | POA: Diagnosis not present

## 2018-04-21 DIAGNOSIS — Y929 Unspecified place or not applicable: Secondary | ICD-10-CM | POA: Diagnosis not present

## 2018-04-21 DIAGNOSIS — M7542 Impingement syndrome of left shoulder: Secondary | ICD-10-CM | POA: Diagnosis not present

## 2018-04-21 DIAGNOSIS — Y939 Activity, unspecified: Secondary | ICD-10-CM | POA: Diagnosis not present

## 2018-04-21 DIAGNOSIS — M25512 Pain in left shoulder: Secondary | ICD-10-CM | POA: Diagnosis present

## 2018-04-21 MED ORDER — CYCLOBENZAPRINE HCL 5 MG PO TABS: 5.0000 mg | ORAL_TABLET | Freq: Three times a day (TID) | ORAL | 0 refills | Status: DC | PRN

## 2018-04-21 MED ORDER — LIDOCAINE 5 % EX PTCH: 1.0000 | MEDICATED_PATCH | CUTANEOUS | 0 refills | Status: AC

## 2018-04-21 MED ORDER — DICLOFENAC SODIUM 50 MG PO TBEC: 50.0000 mg | DELAYED_RELEASE_TABLET | Freq: Two times a day (BID) | ORAL | 0 refills | Status: AC
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
## 2018-04-21 NOTE — ED Triage Notes (Signed)
Patient presents to the ED with left shoulder x 2 months.  Patient states if she reaches up or to the left her pain becomes severe.  Patient states 2 months ago she fell at work and then reached backward and felt a "tear".  Patient states she did not seek care post injury until today.

## 2018-04-21 NOTE — Discharge Instructions (Signed)
Your exam is consistent with shoulder tendinitis. You x-ray is negative for fracture or dislocation. Apply ice tor reduce pain and stiffness.Take the prescription meds as directed. Follow-up with Dr. Allena Katz or your ortho provider for ongoing symptoms.

## 2018-06-13 ENCOUNTER — Encounter: Payer: Self-pay | Admitting: Medical Oncology

## 2018-06-13 ENCOUNTER — Emergency Department: Payer: Self-pay

## 2018-06-13 ENCOUNTER — Emergency Department
Admission: EM | Admit: 2018-06-13 | Discharge: 2018-06-13 | Disposition: A | Discharge status: Home / Self Care | Payer: Self-pay | Attending: Emergency Medicine | Admitting: Emergency Medicine

## 2018-06-13 DIAGNOSIS — Y998 Other external cause status: Secondary | ICD-10-CM | POA: Insufficient documentation

## 2018-06-13 DIAGNOSIS — W010XXA Fall on same level from slipping, tripping and stumbling without subsequent striking against object, initial encounter: Secondary | ICD-10-CM | POA: Insufficient documentation

## 2018-06-13 DIAGNOSIS — Y939 Activity, unspecified: Secondary | ICD-10-CM | POA: Insufficient documentation

## 2018-06-13 DIAGNOSIS — M545 Low back pain, unspecified: Secondary | ICD-10-CM

## 2018-06-13 DIAGNOSIS — Y929 Unspecified place or not applicable: Secondary | ICD-10-CM | POA: Insufficient documentation

## 2018-06-13 DIAGNOSIS — M25512 Pain in left shoulder: Secondary | ICD-10-CM | POA: Insufficient documentation

## 2018-06-13 MED ORDER — HYDROMORPHONE HCL 1 MG/ML IJ SOLN: 1.0000 mg | Freq: Once | INTRAMUSCULAR | Status: DC

## 2018-06-13 MED ORDER — CYCLOBENZAPRINE HCL 10 MG PO TABS: 10.0000 mg | ORAL_TABLET | Freq: Three times a day (TID) | ORAL | 0 refills | Status: DC | PRN

## 2018-06-13 MED ORDER — ORPHENADRINE CITRATE 30 MG/ML IJ SOLN: 60.0000 mg | Freq: Two times a day (BID) | INTRAMUSCULAR | Status: DC

## 2018-06-13 MED ORDER — ORPHENADRINE CITRATE 30 MG/ML IJ SOLN
60.0000 mg | Freq: Two times a day (BID) | INTRAMUSCULAR | Status: DC
  Administered 2018-06-13: 60 mg via INTRAMUSCULAR
  Filled 2018-06-13: qty 2

## 2018-06-13 MED ORDER — HYDROMORPHONE HCL 1 MG/ML IJ SOLN
1.0000 mg | Freq: Once | INTRAMUSCULAR | Status: AC
  Administered 2018-06-13: 1 mg via INTRAMUSCULAR
  Filled 2018-06-13: qty 1

## 2018-06-13 MED ORDER — TRAMADOL HCL 50 MG PO TABS: 50.0000 mg | ORAL_TABLET | Freq: Two times a day (BID) | ORAL | 0 refills | Status: DC | PRN

## 2018-06-13 NOTE — ED Triage Notes (Signed)
Pt reports tripping and falling yesterday in the rain, c/o left shoulder pain with movement and lower back pain. Denies LOC.

## 2018-06-13 NOTE — ED Notes (Signed)
See triage note.  States she slipped yesterday in the rain.  Fell.  Landed on both knees  And then on left shoulder and twisted lower back  Ambulates well to treatment room  No deformity noted to shoulder but having increased pain with movement

## 2018-06-13 NOTE — ED Provider Notes (Signed)
[facility] Emergency Department Provider Note   ____________________________________________   First MD Initiated Contact with Patient 06/13/18 [phone]     (approximate)  I have reviewed the triage vital signs and the nursing notes.   HISTORY  Chief Complaint Fall; Back Pain; and Shoulder Pain    HPI Lauren Pennington is a 62 y.o. female patient complain left shoulder and low back pain secondary to a slip and fall yesterday.  Patient says she slipped and fell in the rain and has had increased pain with movement of the shoulder.  Patient also states pain right lateral back which has increased overnight.  Patient denies radicular component to her back pain.  Patient denies bladder bowel dysfunction.  No palliative measure for complaint.  Patient rates the pain as a 10/10.  Patient described the pain is "achy".  Past Medical History:  Diagnosis Date  . TMJ (dislocation of temporomandibular joint)     There are no active problems to display for this patient.   Past Surgical History:  Procedure Laterality Date  . TUBAL LIGATION      Prior to Admission medications   Medication Sig Start Date End Date Taking? Authorizing Provider  cyclobenzaprine (FLEXERIL) 10 MG tablet Take 1 tablet (10 mg total) by mouth 3 (three) times daily as needed. 06/13/18   Joni Reining, PA-C  cyclobenzaprine (FLEXERIL) 5 MG tablet Take 1 tablet (5 mg total) by mouth 3 (three) times daily as needed for muscle spasms. 04/21/18   Menshew, Charlesetta Ivory, PA-C  traMADol (ULTRAM) 50 MG tablet Take 1 tablet (50 mg total) by mouth every 12 (twelve) hours as needed. 06/13/18   Joni Reining, PA-C    Allergies Patient has no known allergies.  Family History  Problem Relation Age of Onset  . Heart attack Mother     Social History Social History   Tobacco Use  . Smoking status: Never Smoker  . Smokeless tobacco: Never Used  Substance Use Topics  . Alcohol use: Not Currently  .  Drug use: No    Review of Systems Constitutional: No fever/chills Eyes: No visual changes. ENT: No sore throat. Cardiovascular: Denies chest pain. Respiratory: Denies shortness of breath. Gastrointestinal: No abdominal pain.  No nausea, no vomiting.  No diarrhea.  No constipation. Genitourinary: Negative for dysuria. Musculoskeletal: Left shoulder and right back pain.. Skin: Negative for rash. Neurological: Negative for headaches, focal weakness or numbness.   ____________________________________________   PHYSICAL EXAM:  VITAL SIGNS: ED Triage Vitals  Enc Vitals Group     BP 06/13/18 0726 130/81     Pulse Rate 06/13/18 0726 73     Resp 06/13/18 0726 20     Temp 06/13/18 0726 97.7 F (36.5 C)     Temp Source 06/13/18 0726 Oral     SpO2 06/13/18 0726 96 %     Weight 06/13/18 0724 128 lb (58.1 kg)     Height 06/13/18 0724 5\' 4"  (1.626 m)     Head Circumference --      Peak Flow --      Pain Score 06/13/18 0724 10     Pain Loc --      Pain Edu? --      Excl. in [place]? --    Constitutional: Alert and oriented. Well appearing and in no acute distress. Neck:No cervical spine tenderness to palpation. Hematological/Lymphatic/Immunilogical: No cervical lymphadenopathy. Cardiovascular: Normal rate, regular rhythm. Grossly normal heart sounds.  Good peripheral circulation. Respiratory: Normal respiratory  effort.  No retractions. Lungs CTAB. Gastrointestinal: Soft and nontender. No distention. No abdominal bruits. No CVA tenderness. Musculoskeletal: No obvious deformity to the left shoulder.  Patient decreased range of motion with abduction and overhead reaching limited by complaint of pain.  Examination lumbar spine shows no obvious deformity.  Patient has decreased range of motion is all fields.  Patient has right paraspinal muscle spasms.  No guarding with palpation of spinal processes. Neurologic:  Normal speech and language. No gross focal neurologic deficits are appreciated. No  gait instability. Skin:  Skin is warm, dry and intact. No rash noted. Psychiatric: Mood and affect are normal. Speech and behavior are normal.  ____________________________________________   LABS (all labs ordered are listed, but only abnormal results are displayed)  Labs Reviewed - No data to display ____________________________________________  EKG   ____________________________________________  RADIOLOGY  ED MD interpretation:    Official radiology report(s): Dg Lumbar Spine 2-3 Views  Result Date: 06/13/2018 CLINICAL DATA:  Slipped yesterday in the rain, now with low back pain. EXAM: LUMBAR SPINE - 2-3 VIEW COMPARISON:  None. FINDINGS: There are 5 non rib-bearing lumbar type vertebral bodies. Mild scoliotic curvature of the thoracolumbar spine with dominant caudal component convex to the right measuring approximately 11 degrees (as measured from the superior endplate of L2 to the inferior endplate of L4). No anterolisthesis or retrolisthesis. Lumbar vertebral body heights appear preserved. Mild to moderate multilevel lumbar spine DDD worse at L5-S1, and to a lesser extent, L4-L5 with disc space height loss, endplate irregularity and sclerosis. Limited visualization of the bilateral SI joints and hips is normal. Punctate phleboliths overlie the lower pelvis bilaterally. Moderate colonic stool burden without evidence of enteric obstruction. IMPRESSION: 1. No definite acute findings. 2. Mild-to-moderate multilevel lumbar spine DDD worse at L5-S1. 3. Mild scoliotic curvature of the thoracolumbar spine Electronically Signed   By: Simonne Come M.D.   On: 06/13/2018 08:05   Dg Shoulder Left  Result Date: 06/13/2018 CLINICAL DATA:  Slipped and fell in rain yesterday, now with left shoulder pain. EXAM: LEFT SHOULDER - 2+ VIEW COMPARISON:  04/21/2018 FINDINGS: No fracture or dislocation. Mild degenerative change of the left glenohumeral joint with joint space loss, subchondral sclerosis and  inferiorly directed osteophytosis. Limited visualization of the adjacent acromioclavicular joint appears normal. No evidence of calcific tendinitis. Limited visualization of the adjacent thorax is normal. No definite acutely displaced rib fractures. Regional soft tissues appear normal. IMPRESSION: 1. No acute findings. 2. Mild degenerative change of the left glenohumeral joint. Electronically Signed   By: Simonne Come M.D.   On: 06/13/2018 08:08    ____________________________________________   PROCEDURES  Procedure(s) performed: None  Procedures  Critical Care performed: No  ____________________________________________   INITIAL IMPRESSION / ASSESSMENT AND PLAN / ED COURSE  As part of my medical decision making, I reviewed the following data within the electronic MEDICAL RECORD NUMBER    Left shoulder and back pain secondary to fall.  Discussed x-ray findings with patient revealing only degenerative changes in the shoulder and lumbar spine.  Patient placed in arm sling and given discharge care instruction with a work note.  [facility].      ____________________________________________   FINAL CLINICAL IMPRESSION(S) / ED DIAGNOSES  Final diagnoses:  Acute pain of left shoulder  Acute midline low back pain without sciatica        Note:  This document was prepared using Dragon voice recognition software and may include unintentional dictation errors.  Joni ReiningSmith, Rheagan Nayak K, PA-C 06/13/18 [id number]    Emily FilbertWilliams, Jonathan E, MD 06/13/18 [phone]

## 2019-03-24 ENCOUNTER — Other Ambulatory Visit: Payer: Self-pay

## 2019-03-24 DIAGNOSIS — Z20822 Contact with and (suspected) exposure to covid-19: Secondary | ICD-10-CM

## 2019-03-26 LAB — NOVEL CORONAVIRUS, NAA: SARS-CoV-2, NAA: NOT DETECTED

## 2019-11-05 ENCOUNTER — Other Ambulatory Visit: Payer: Self-pay

## 2019-11-05 ENCOUNTER — Emergency Department: Payer: Self-pay

## 2019-11-05 ENCOUNTER — Encounter: Payer: Self-pay | Admitting: Emergency Medicine

## 2019-11-05 ENCOUNTER — Emergency Department
Admission: EM | Admit: 2019-11-05 | Discharge: 2019-11-05 | Disposition: A | Discharge status: Home / Self Care | Payer: Self-pay | Attending: Emergency Medicine | Admitting: Emergency Medicine

## 2019-11-05 DIAGNOSIS — M25511 Pain in right shoulder: Secondary | ICD-10-CM | POA: Insufficient documentation

## 2019-11-05 DIAGNOSIS — G8911 Acute pain due to trauma: Secondary | ICD-10-CM | POA: Insufficient documentation

## 2019-11-05 MED ORDER — TRAMADOL HCL 50 MG PO TABS: 50.0000 mg | ORAL_TABLET | Freq: Four times a day (QID) | ORAL | 0 refills | Status: DC | PRN

## 2019-11-05 MED ORDER — MELOXICAM 15 MG PO TABS: 15.0000 mg | ORAL_TABLET | Freq: Every day | ORAL | 2 refills | Status: AC

## 2019-11-05 NOTE — Discharge Instructions (Signed)
Follow-up with [facility].  Please call for an appointment.  Keep your arm in the sling for comfort.  You do not have to sleep in it.  You do need to take your arm out of the sling daily and do range of motion exercises.  Apply ice to the right shoulder.  Take medication as prescribed.  Return if worsening.

## 2019-11-05 NOTE — ED Provider Notes (Signed)
[facility] Emergency Department Provider Note  ____________________________________________   First MD Initiated Contact with Patient 11/05/19 1757     (approximate)  I have reviewed the triage vital signs and the nursing notes.   HISTORY  Chief Complaint Shoulder Injury    HPI Lauren Pennington is a 64 y.o. female presents emergency department complaining of right shoulder pain.  States she was cooking dinner at home slipped and hit her shoulder on the edge of a cabinet.  Immediate pain.  This happened approximately 1 week ago.  She states she has a popping sensation in the right shoulder with movement.  Some numbness and tingling distally.  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  She has used over-the-counter medications and ice without relief    Past Medical History:  Diagnosis Date  . TMJ (dislocation of temporomandibular joint)     There are no problems to display for this patient.   Past Surgical History:  Procedure Laterality Date  . TUBAL LIGATION      Prior to Admission medications   Medication Sig Start Date End Date Taking? Authorizing Provider  meloxicam (MOBIC) 15 MG tablet Take 1 tablet (15 mg total) by mouth daily. 11/05/19 11/04/20  Ponce Skillman, Linden Dolin, PA-C  traMADol (ULTRAM) 50 MG tablet Take 1 tablet (50 mg total) by mouth every 6 (six) hours as needed. 11/05/19   Versie Starks, PA-C    Allergies Patient has no known allergies.  Family History  Problem Relation Age of Onset  . Heart attack Mother     Social History Social History   Tobacco Use  . Smoking status: Never Smoker  . Smokeless tobacco: Never Used  Substance Use Topics  . Alcohol use: Not Currently  . Drug use: No    Review of Systems  Constitutional: No fever/chills Eyes: No visual changes. ENT: No sore throat. Respiratory: Denies cough Genitourinary: Negative for dysuria. Musculoskeletal: Negative for back  pain.  Positive for right shoulder pain Skin: Negative for rash. Psychiatric: no mood changes,     ____________________________________________   PHYSICAL EXAM:  VITAL SIGNS: ED Triage Vitals  Enc Vitals Group     BP 11/05/19 1736 126/73     Pulse Rate 11/05/19 1736 80     Resp 11/05/19 1736 16     Temp 11/05/19 1736 98.1 F (36.7 C)     Temp Source 11/05/19 1736 Oral     SpO2 11/05/19 1736 98 %     Weight 11/05/19 1734 129 lb (58.5 kg)     Height 11/05/19 1734 5\' 5"  (1.651 m)     Head Circumference --      Peak Flow --      Pain Score 11/05/19 1734 10     Pain Loc --      Pain Edu? --      Excl. in [place]? --     Constitutional: Alert and oriented. Well appearing and in no acute distress. Eyes: Conjunctivae are normal.  Head: Atraumatic. Nose: No congestion/rhinnorhea. Mouth/Throat: Mucous membranes are moist.   Neck:  supple no lymphadenopathy noted Cardiovascular: Normal rate, regular rhythm.  Respiratory: Normal respiratory effort.  No retractions GU: deferred Musculoskeletal: FROM all extremities, warm and well perfused, Pap as noted with overhead extension, grips are equal bilaterally, right shoulder is tender along the joint, no bruising redness or swelling are noted Neurologic:  Normal speech and language.  Skin:  Skin is  warm, dry and intact. No rash noted. Psychiatric: Mood and affect are normal. Speech and behavior are normal.  ____________________________________________   LABS (all labs ordered are listed, but only abnormal results are displayed)  Labs Reviewed - No data to display ____________________________________________   ____________________________________________  RADIOLOGY  3 the right shoulder is normal  ____________________________________________   PROCEDURES  Procedure(s) performed: Sling applied by nursing staff   Procedures    ____________________________________________   INITIAL IMPRESSION / ASSESSMENT AND PLAN / ED  COURSE  Pertinent labs & imaging results that were available during my care of the patient were reviewed by me and considered in my medical decision making (see chart for details).   Patient is a 64 year old female presents emergency department with concerns of right shoulder pain and injury.  See HPI  Physical exam shows patient to appear well.  Right shoulder is tender to palpation.  See exam note for remainder of the physical exam of the shoulder.  Remainder the exam is unremarkable  X-ray of the right shoulder is negative  I explained the findings to the patient.  Due to the ongoing pain of the right shoulder I feel she may have some bursitis along with a tendon or ligament injury.  She was placed in a sling.  She is to follow-up with orthopedics.  I gave her a work note stating no use of the right arm until seen by orthopedics.  She was given a prescription for meloxicam and tramadol.  She states she understands the instructions.  She was discharged stable condition.    Lauren Pennington was evaluated in Emergency Department on 11/05/2019 for the symptoms described in the history of present illness. She was evaluated in the context of the global COVID-19 pandemic, which necessitated consideration that the patient might be at risk for infection with the SARS-CoV-2 virus that causes COVID-19. Institutional protocols and algorithms that pertain to the evaluation of patients at risk for COVID-19 are in a state of rapid change based on information released by regulatory bodies including the CDC and federal and state organizations. These policies and algorithms were followed during the patient's care in the ED.   As part of my medical decision making, I reviewed the following data within the electronic MEDICAL RECORD NUMBER Nursing notes reviewed and incorporated, Old chart reviewed, Radiograph reviewed , Notes from prior ED visits and [place] Controlled Substance  Database  ____________________________________________   FINAL CLINICAL IMPRESSION(S) / ED DIAGNOSES  Final diagnoses:  Acute pain of right shoulder due to trauma      NEW MEDICATIONS STARTED DURING THIS VISIT:  New Prescriptions   MELOXICAM (MOBIC) 15 MG TABLET    Take 1 tablet (15 mg total) by mouth daily.   TRAMADOL (ULTRAM) 50 MG TABLET    Take 1 tablet (50 mg total) by mouth every 6 (six) hours as needed.     Note:  This document was prepared using Dragon voice recognition software and may include unintentional dictation errors.    Faythe Ghee, PA-C 11/05/19 1850    Emily Filbert, MD 11/05/19 Windell Moment

## 2019-11-05 NOTE — ED Triage Notes (Signed)
Pt here for right shoulder pain. Larey Seat one week ago and landed on right shoulder; slipped on broccoli.  Pain with movement, reports hears a popping sometimes.  Has tried managing at home without relief. 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

## 2019-12-26 ENCOUNTER — Other Ambulatory Visit: Payer: Self-pay

## 2019-12-26 ENCOUNTER — Emergency Department: Payer: Self-pay

## 2019-12-26 ENCOUNTER — Encounter: Payer: Self-pay | Admitting: Emergency Medicine

## 2019-12-26 ENCOUNTER — Emergency Department
Admission: EM | Admit: 2019-12-26 | Discharge: 2019-12-26 | Disposition: A | Discharge status: Home / Self Care | Payer: Self-pay | Attending: Emergency Medicine | Admitting: Emergency Medicine

## 2019-12-26 DIAGNOSIS — Z5321 Procedure and treatment not carried out due to patient leaving prior to being seen by health care provider: Secondary | ICD-10-CM | POA: Insufficient documentation

## 2019-12-26 DIAGNOSIS — R2243 Localized swelling, mass and lump, lower limb, bilateral: Secondary | ICD-10-CM | POA: Insufficient documentation

## 2019-12-26 DIAGNOSIS — R079 Chest pain, unspecified: Secondary | ICD-10-CM | POA: Insufficient documentation

## 2019-12-26 LAB — BASIC METABOLIC PANEL
Anion gap: 8 (ref 5–15)
BUN: 23 mg/dL (ref 8–23)
CO2: 26 mmol/L (ref 22–32)
Calcium: 9.4 mg/dL (ref 8.9–10.3)
Chloride: 103 mmol/L (ref 98–111)
Creatinine, Ser: 0.56 mg/dL (ref 0.44–1.00)
GFR calc Af Amer: >60 mL/min (ref >60)
GFR calc non Af Amer: >60 mL/min (ref >60)
Glucose, Bld: 117 mg/dL — ABNORMAL HIGH (ref 70–99)
Potassium: 4 mmol/L (ref 3.5–5.1)
Sodium: 137 mmol/L (ref 135–145)

## 2019-12-26 LAB — TROPONIN I (HIGH SENSITIVITY)
Troponin I (High Sensitivity): 3 ng/L (ref <18)
Troponin I (High Sensitivity): 3 ng/L (ref <18)

## 2019-12-26 LAB — CBC
HCT: 39.8 % (ref 36.0–46.0)
Hemoglobin: 13.5 g/dL (ref 12.0–15.0)
MCH: 30.4 pg (ref 26.0–34.0)
MCHC: 33.9 g/dL (ref 30.0–36.0)
MCV: 89.6 fL (ref 80.0–100.0)
Platelets: 273 10*3/uL (ref 150–400)
RBC: 4.44 MIL/uL (ref 3.87–5.11)
RDW: 12.7 % (ref 11.5–15.5)
WBC: 7.2 10*3/uL (ref 4.0–10.5)
nRBC: 0 % (ref 0.0–0.2)

## 2019-12-26 NOTE — ED Triage Notes (Signed)
Pt reports no medical hx at all. Pt states that she got the COVID shot last Monday and her sx's started Tuesday and she is concerned.

## 2019-12-26 NOTE — ED Notes (Signed)
Called several times from lobby & on phone # listed in chart with no answer 

## 2019-12-26 NOTE — ED Notes (Signed)
No answer when called several times from lobby 

## 2019-12-26 NOTE — ED Triage Notes (Signed)
Pt reports mid CP that started last week and is pressure like in nature. Pt reports pain radiates into jaw and also has some swelling in both of her legs.

## 2019-12-26 NOTE — ED Notes (Signed)
Pt updated in WR, VS reassessed °

## 2021-11-03 IMAGING — CR DG SHOULDER 2+V*R*
3 series · 3 of 3 positions shown · non-contrast
Comparison: None.

CLINICAL DATA: Fell 1 week ago, right shoulder pain

EXAM:
RIGHT SHOULDER - 2+ VIEW

[shoulder grashey]
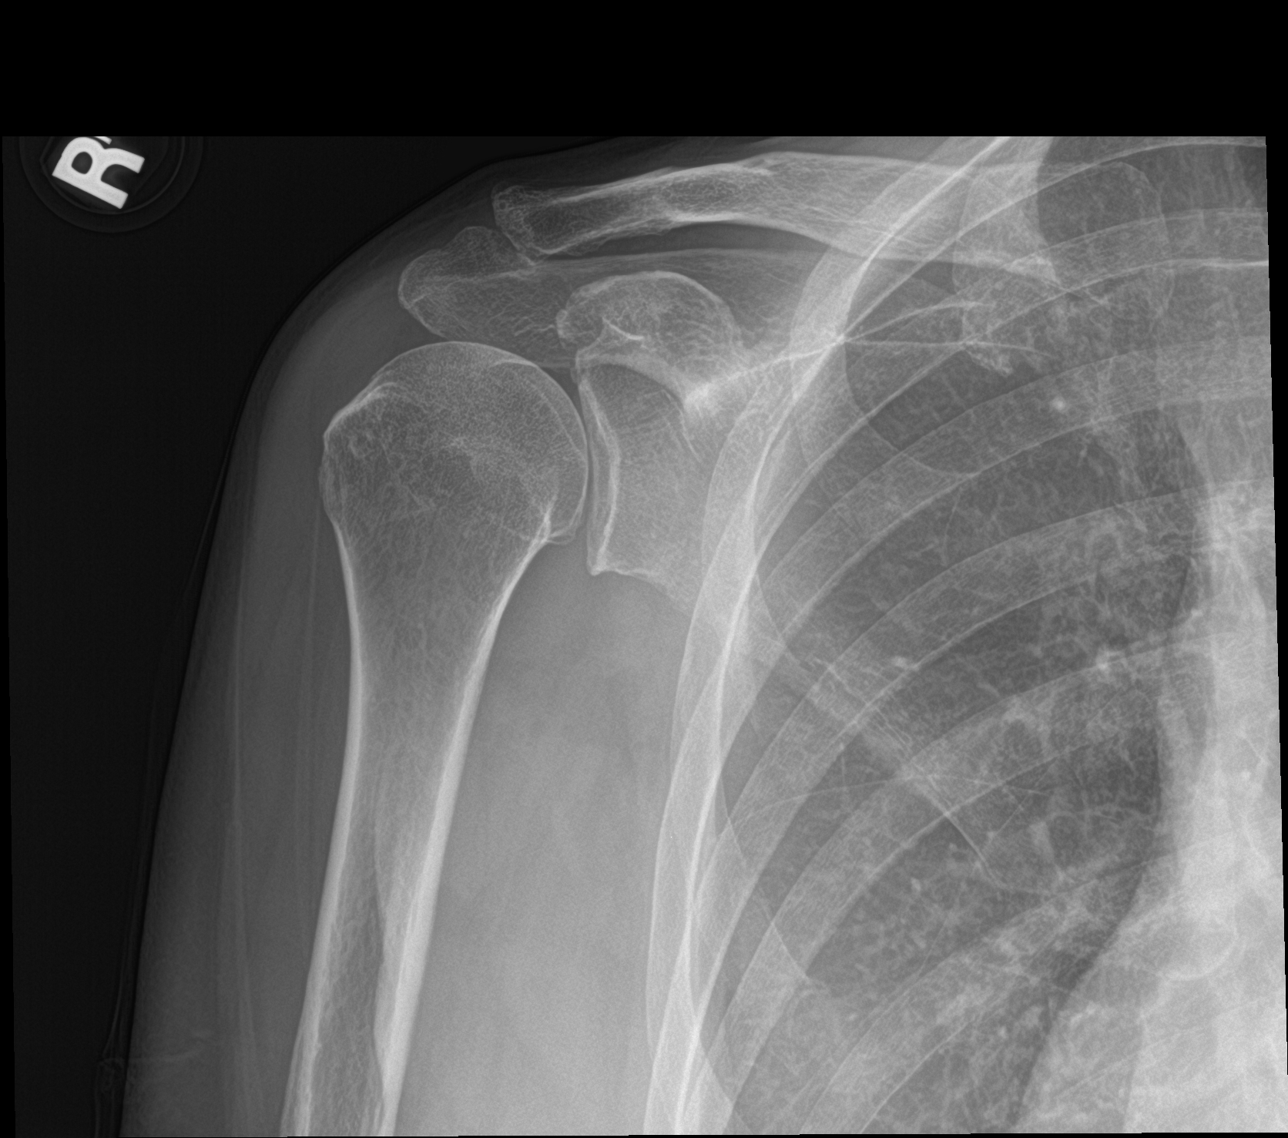

[shoulder y view]
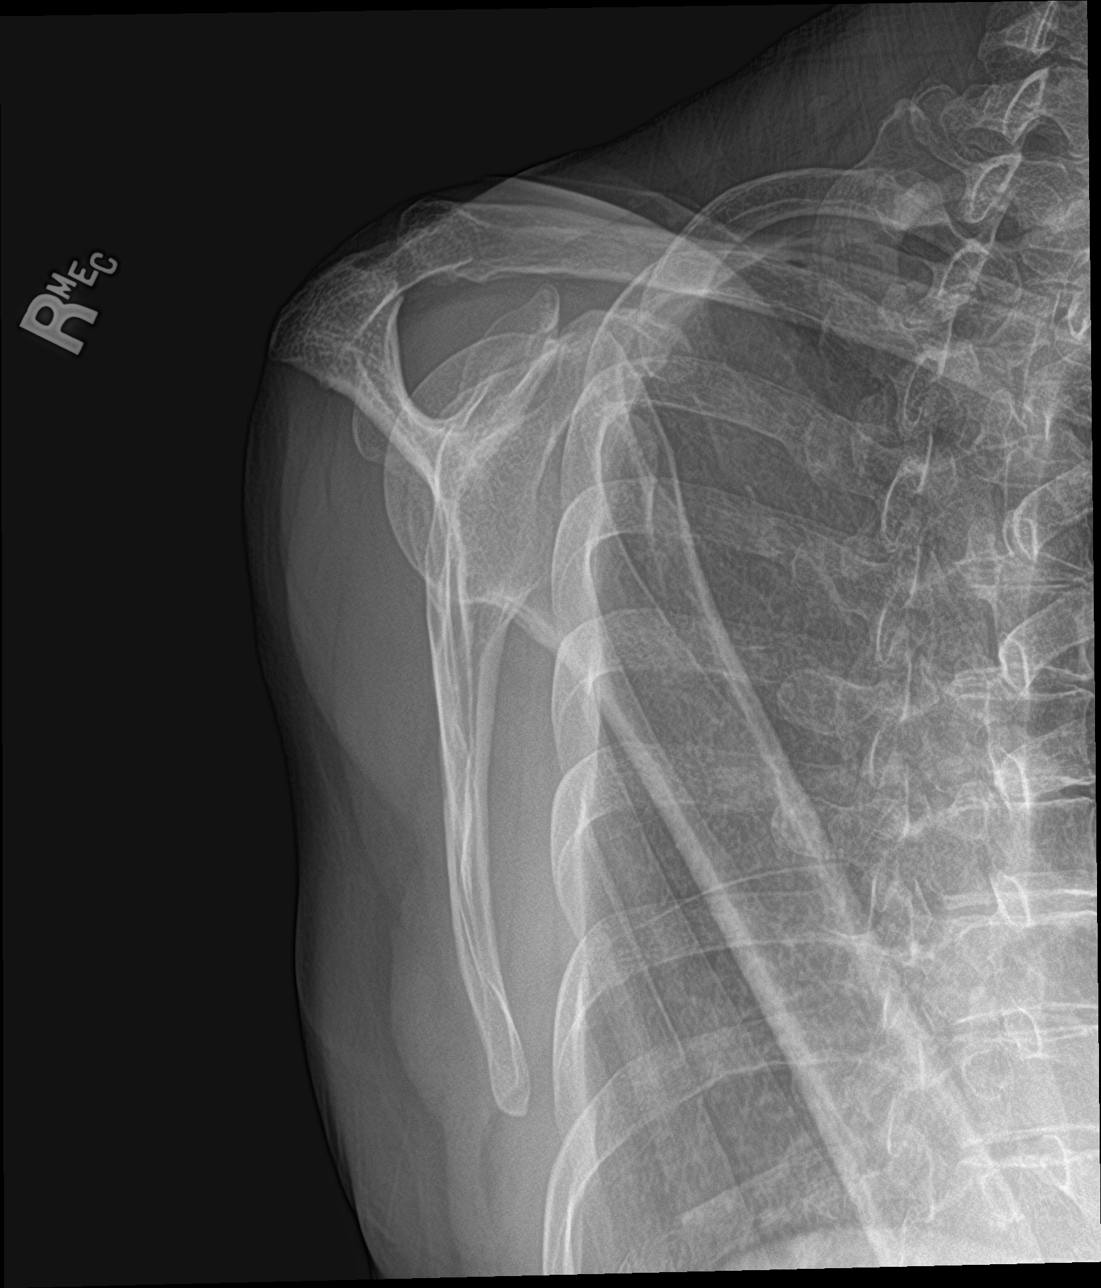

[shoulder axillary]
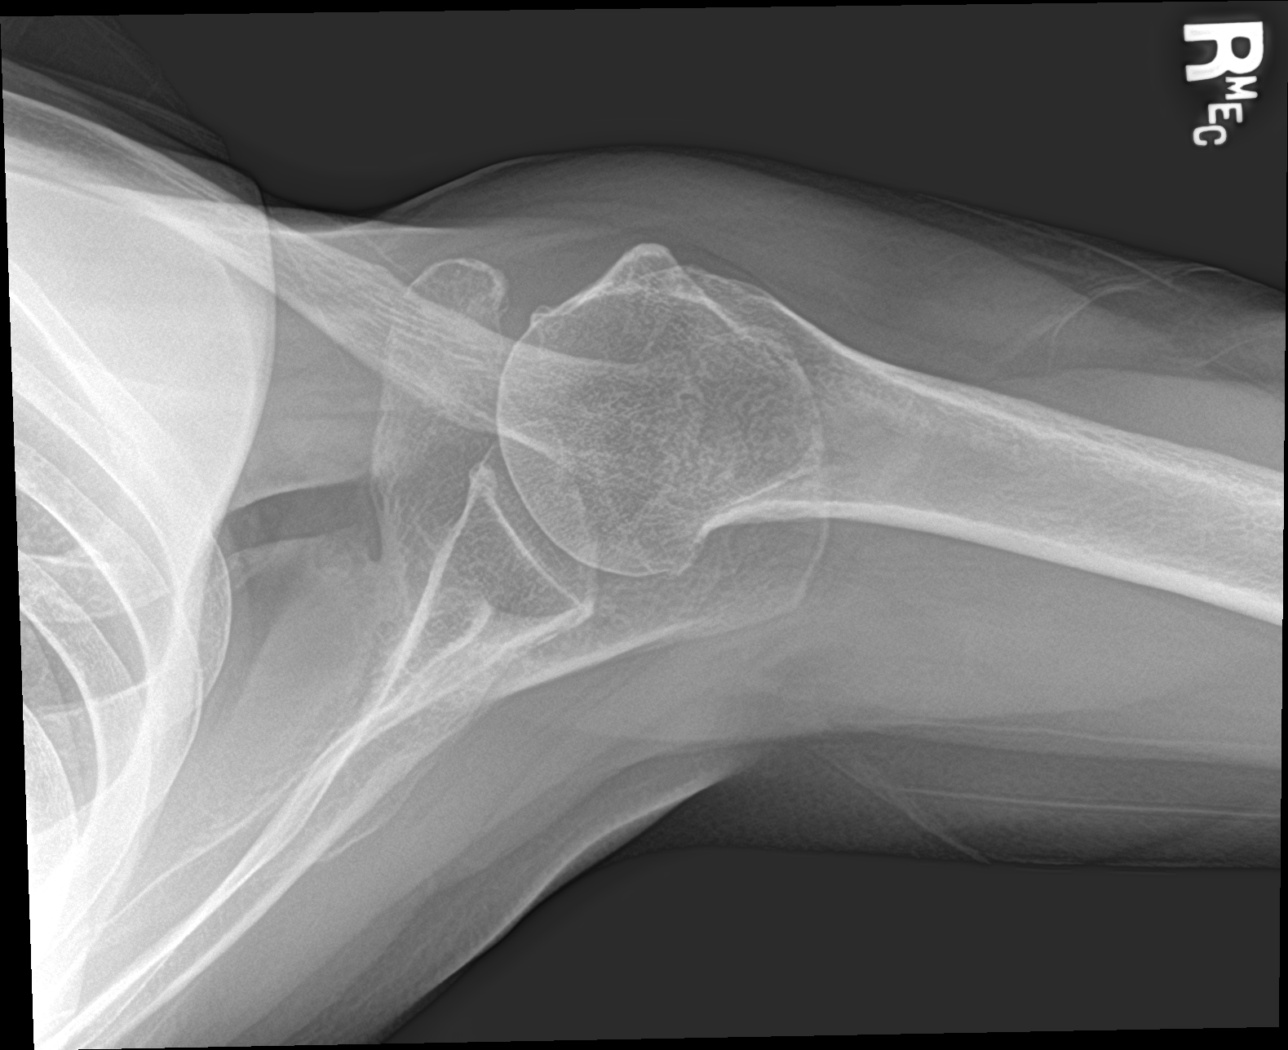

[3 of 3 positions shown; findings below may reference images not displayed]

FINDINGS: Frontal, transscapular, and axillary views of the right shoulder are
obtained. No fracture, subluxation, or dislocation. Mild
osteoarthritis of the acromioclavicular and glenohumeral joints. The
right chest is clear.
IMPRESSION: 1. Osteoarthritis, no acute fracture.

## 2021-12-24 IMAGING — CR DG CHEST 2V
1 series · 2 of 2 positions shown · non-contrast
Comparison: 07/01/2017

CLINICAL DATA: Chest pressure for 1 week, pain radiates to jaw,
lower extremity edema

EXAM:
CHEST - 2 VIEW

[Series 1: dg chest 2 view · 0.14mm/px · 2 of 2 slices shown]
[im 1/2]
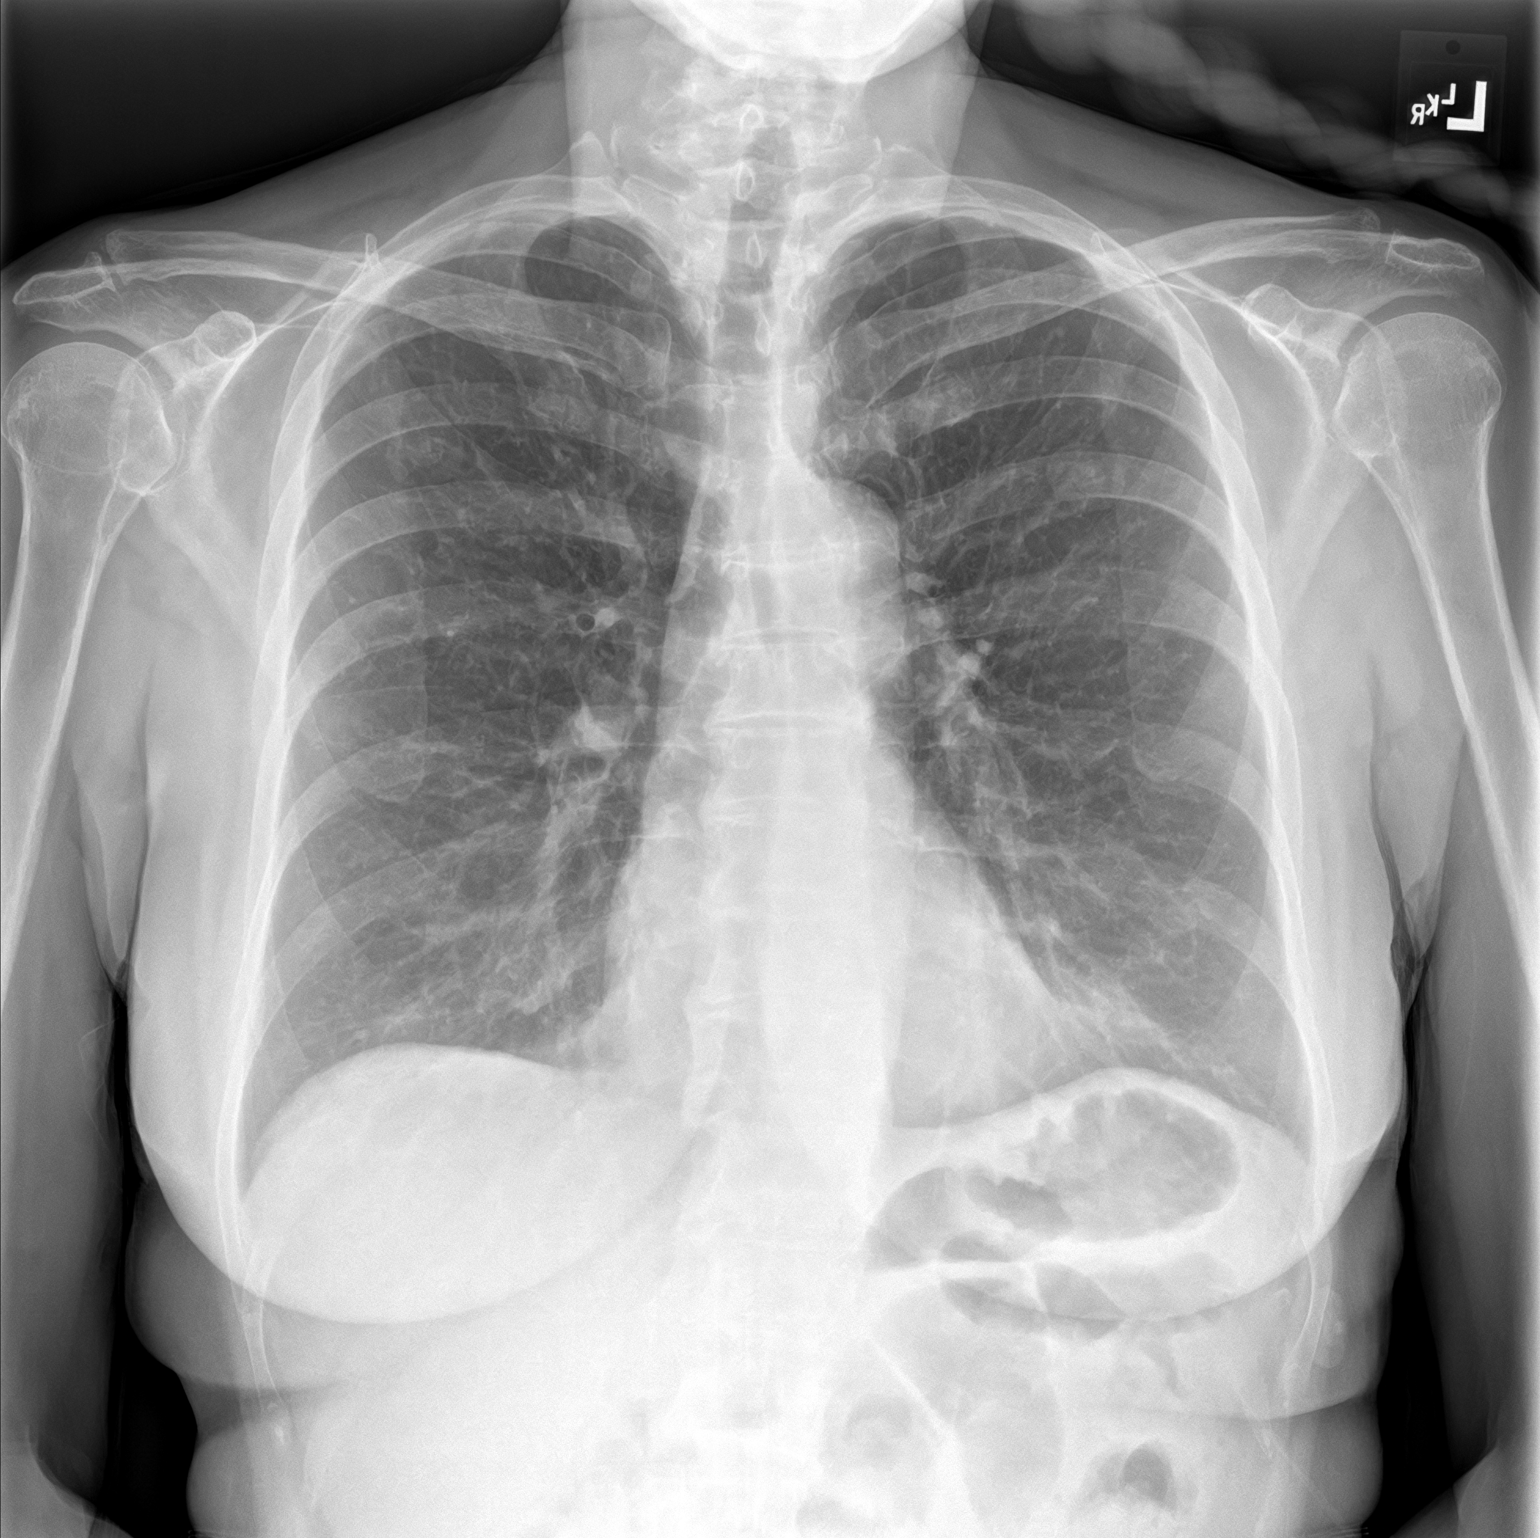
[im 2/2]
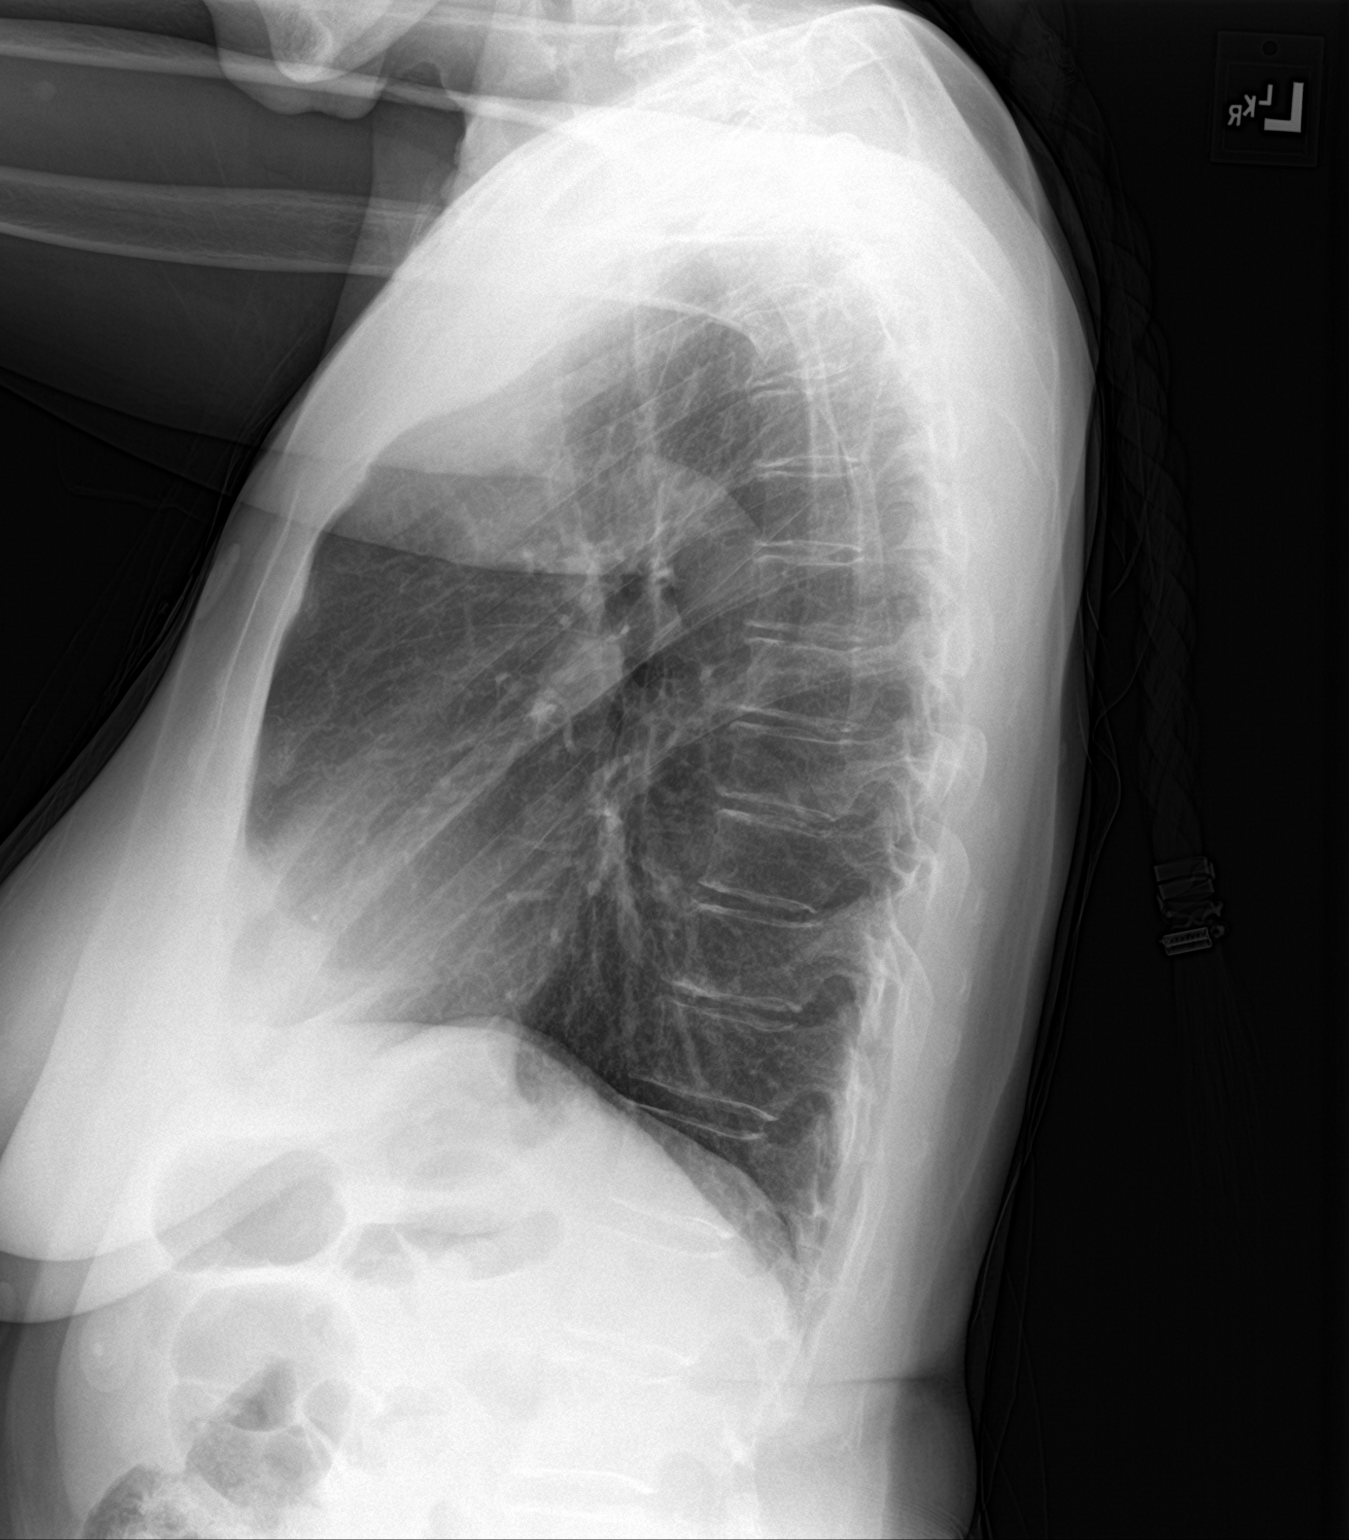

[2 of 2 positions shown; findings below may reference images not displayed]

FINDINGS: The heart size and mediastinal contours are within normal limits.
Both lungs are clear. The visualized skeletal structures are
unremarkable.
IMPRESSION: No active cardiopulmonary disease.

## 2022-01-19 ENCOUNTER — Inpatient Hospital Stay: Admission: RE | Admit: 2022-01-19 | Payer: Self-pay | Source: Ambulatory Visit

## 2022-01-19 ENCOUNTER — Other Ambulatory Visit: Payer: Self-pay | Admitting: Internal Medicine

## 2022-01-19 DIAGNOSIS — Z1231 Encounter for screening mammogram for malignant neoplasm of breast: Secondary | ICD-10-CM

## 2022-02-12 ENCOUNTER — Ambulatory Visit
Admission: RE | Admit: 2022-02-12 | Discharge: 2022-02-12 | Disposition: A | Discharge status: Home / Self Care | Payer: Medicare HMO | Source: Ambulatory Visit | Attending: Internal Medicine | Admitting: Internal Medicine

## 2022-02-12 ENCOUNTER — Other Ambulatory Visit: Payer: Self-pay | Admitting: Internal Medicine

## 2022-02-12 DIAGNOSIS — M25562 Pain in left knee: Secondary | ICD-10-CM

## 2022-02-12 DIAGNOSIS — M25511 Pain in right shoulder: Secondary | ICD-10-CM

## 2022-03-28 ENCOUNTER — Other Ambulatory Visit: Payer: Self-pay

## 2022-03-28 ENCOUNTER — Emergency Department: Payer: Medicare HMO

## 2022-03-28 ENCOUNTER — Emergency Department
Admission: EM | Admit: 2022-03-28 | Discharge: 2022-03-28 | Disposition: A | Discharge status: Home / Self Care | Payer: Medicare HMO | Attending: Emergency Medicine | Admitting: Emergency Medicine

## 2022-03-28 ENCOUNTER — Encounter: Payer: Self-pay | Admitting: Intensive Care

## 2022-03-28 DIAGNOSIS — R1084 Generalized abdominal pain: Secondary | ICD-10-CM

## 2022-03-28 DIAGNOSIS — M549 Dorsalgia, unspecified: Secondary | ICD-10-CM

## 2022-03-28 DIAGNOSIS — N200 Calculus of kidney: Secondary | ICD-10-CM | POA: Diagnosis not present

## 2022-03-28 DIAGNOSIS — R110 Nausea: Secondary | ICD-10-CM

## 2022-03-28 HISTORY — DX: Intestinal parasitism, unspecified: B82.9

## 2022-03-28 LAB — URINALYSIS, ROUTINE W REFLEX MICROSCOPIC
Bacteria, UA: NONE SEEN
Bilirubin Urine: NEGATIVE
Glucose, UA: NEGATIVE mg/dL
Ketones, ur: 5 mg/dL — AB
Nitrite: NEGATIVE
Protein, ur: 30 mg/dL — AB
Specific Gravity, Urine: 1.027 (ref 1.005–1.030)
pH: 5 (ref 5.0–8.0)

## 2022-03-28 LAB — CBC
HCT: 34.5 % — ABNORMAL LOW (ref 36.0–46.0)
Hemoglobin: 10.3 g/dL — ABNORMAL LOW (ref 12.0–15.0)
MCH: 21.2 pg — ABNORMAL LOW (ref 26.0–34.0)
MCHC: 29.9 g/dL — ABNORMAL LOW (ref 30.0–36.0)
MCV: 71 fL — ABNORMAL LOW (ref 80.0–100.0)
Platelets: 258 10*3/uL (ref 150–400)
RBC: 4.86 MIL/uL (ref 3.87–5.11)
RDW: 17.2 % — ABNORMAL HIGH (ref 11.5–15.5)
WBC: 6.6 10*3/uL (ref 4.0–10.5)
nRBC: 0 % (ref 0.0–0.2)

## 2022-03-28 LAB — COMPREHENSIVE METABOLIC PANEL
ALT: 11 U/L (ref 0–44)
AST: 16 U/L (ref 15–41)
Albumin: 3.7 g/dL (ref 3.5–5.0)
Alkaline Phosphatase: 81 U/L (ref 38–126)
Anion gap: 14 (ref 5–15)
BUN: 18 mg/dL (ref 8–23)
CO2: 23 mmol/L (ref 22–32)
Calcium: 9.3 mg/dL (ref 8.9–10.3)
Chloride: 104 mmol/L (ref 98–111)
Creatinine, Ser: 0.98 mg/dL (ref 0.44–1.00)
GFR, Estimated: >60 mL/min (ref >60)
Glucose, Bld: 162 mg/dL — ABNORMAL HIGH (ref 70–99)
Potassium: 3.8 mmol/L (ref 3.5–5.1)
Sodium: 141 mmol/L (ref 135–145)
Total Bilirubin: 0.7 mg/dL (ref 0.3–1.2)
Total Protein: 8 g/dL (ref 6.5–8.1)

## 2022-03-28 LAB — LIPASE, BLOOD: Lipase: 42 U/L (ref 11–51)

## 2022-03-28 MED ORDER — KETOROLAC TROMETHAMINE 30 MG/ML IJ SOLN
30.0000 mg | Freq: Once | INTRAMUSCULAR | Status: AC
  Administered 2022-03-28: 30 mg via INTRAMUSCULAR
  Filled 2022-03-28: qty 1

## 2022-03-28 MED ORDER — ONDANSETRON 4 MG PO TBDP: 4.0000 mg | ORAL_TABLET | Freq: Three times a day (TID) | ORAL | 0 refills | Status: AC | PRN

## 2022-03-28 MED ORDER — KETOROLAC TROMETHAMINE 10 MG PO TABS: 10.0000 mg | ORAL_TABLET | Freq: Four times a day (QID) | ORAL | 0 refills | Status: AC | PRN

## 2022-03-28 MED ORDER — ONDANSETRON 4 MG PO TBDP
4.0000 mg | ORAL_TABLET | Freq: Once | ORAL | Status: AC
  Administered 2022-03-28: 4 mg via ORAL
  Filled 2022-03-28: qty 1

## 2022-03-28 MED ORDER — TAMSULOSIN HCL 0.4 MG PO CAPS: 0.4000 mg | ORAL_CAPSULE | Freq: Every day | ORAL | 0 refills | Status: AC

## 2022-03-28 NOTE — ED Provider Notes (Signed)
[facility] Emergency Department Provider Note   ____________________________________________   Event Date/Time   First MD Initiated Contact with Patient 03/28/22 1518     (approximate)  I have reviewed the triage vital signs and the nursing notes.   HISTORY  Chief Complaint Abdominal Pain    HPI Lauren Pennington presents to the emergency room for complaint of generalized abdominal pain/nausea. Patient reports that last week she was seen by her primary care provider after having abdominal bloating/nausea and noticing "parasites" in her stool.  She reports that she was tested for parasites and told that she had "intestinal parasites" and treated with a medication that she does not recall the name of.  She reports that she took 2 pills at 1 time.  Patient reports after taking these pills that her abdominal pain/nausea has resolved until approximately 2 days ago.  She reports that her primary care provider called to check on her Friday and she was not feeling better.  They had recommended that she have x-rays this upcoming week however her symptoms have gotten worse so she reported to the emergency room today Patient reports that over the last 2 days she has been experiencing abdominal bloating/generalized abdominal pain/nausea. Patient is also complaining of intermittent lower back pain. Patient reports that she is having loose stools after ingesting solid foods.  Patient also reports that she has been having some burning with urination. Patient reports that for approximately 3 days she had not seen any "white specks" in her stool.  However, the appearance of the "parasites" have returned. Patient denies urinary frequency/urgency/dysuria/hematuria. Patient denies vomiting/constipation. Patient denies fevers. Patient reports that she feels like there is something "gnawing at the inside of my bellybutton".  Patient also reports that she is having  discharge from her bellybutton.  However, on exam there is no drainage from the bellybutton.  Patient reports there is a foul odor from her bellybutton.  However, on exam there is no foul odor noted from the bellybutton..   Past Medical History:  Diagnosis Date   Parasites in stool    TMJ (dislocation of temporomandibular joint)     There are no problems to display for this patient.   Past Surgical History:  Procedure Laterality Date   TUBAL LIGATION      Prior to Admission medications   Medication Sig Start Date End Date Taking? Authorizing Provider  ketorolac (TORADOL) 10 MG tablet Take 1 tablet (10 mg total) by mouth every 6 (six) hours as needed for up to 5 days. 03/28/22 04/02/22 Yes Herschell Dimes, NP  ondansetron (ZOFRAN-ODT) 4 MG disintegrating tablet Take 1 tablet (4 mg total) by mouth every 8 (eight) hours as needed for up to 5 days for nausea or vomiting. 03/28/22 04/02/22 Yes Herschell Dimes, NP  tamsulosin (FLOMAX) 0.4 MG CAPS capsule Take 1 capsule (0.4 mg total) by mouth daily for 10 days. 03/28/22 04/07/22 Yes Herschell Dimes, NP  traMADol (ULTRAM) 50 MG tablet Take 1 tablet (50 mg total) by mouth every 6 (six) hours as needed. 11/05/19   Faythe Ghee, PA-C    Allergies Patient has no known allergies.  Family History  Problem Relation Age of Onset   Heart attack Mother     Social History Social History   Tobacco Use   Smoking status: Never   Smokeless tobacco: Never  Vaping Use   Vaping Use: Never used  Substance Use Topics   Alcohol use:  Not Currently   Drug use: No    Review of Systems  Constitutional: No fever/chills Eyes: No visual changes. ENT: No sore throat. Cardiovascular: Denies chest pain. Respiratory: Denies shortness of breath. Gastrointestinal: Positive abdominal pain. Positive nausea.  Positive loose stools.  No vomiting. No constipation. Genitourinary: Negative for dysuria.  Positive burning with urination Musculoskeletal: Positive for  lower back pain Skin: Negative for rash. Neurological: Negative for headaches, focal weakness or numbness.   ____________________________________________   PHYSICAL EXAM:  VITAL SIGNS: ED Triage Vitals  Enc Vitals Group     BP 03/28/22 1420 (!) 141/85     Pulse Rate 03/28/22 1420 84     Resp 03/28/22 1420 16     Temp 03/28/22 1420 97.8 F (36.6 C)     Temp Source 03/28/22 1420 Oral     SpO2 03/28/22 1420 98 %     Weight 03/28/22 1421 138 lb (62.6 kg)     Height 03/28/22 1421 5\' 4"  (1.626 m)     Head Circumference --      Peak Flow --      Pain Score 03/28/22 1421 10     Pain Loc --      Pain Edu? --      Excl. in GC? --     Constitutional: Alert and oriented. Well appearing and in no acute distress. Eyes: Conjunctivae are normal. PERRL. EOMI. Head: Atraumatic. Nose: No congestion/rhinnorhea. Mouth/Throat: Mucous membranes are moist.  Oropharynx non-erythematous. Neck: No stridor.   Cardiovascular: Normal rate, regular rhythm. Grossly normal heart sounds.  Good peripheral circulation. Respiratory: Normal respiratory effort.  No retractions. Lungs CTAB. Gastrointestinal: Soft and nontender. No distention. No abdominal bruits. No CVA tenderness.  There is no drainage or odor coming from umbilicus.  No abdominal guarding or rebound noted. Musculoskeletal: No lower extremity tenderness nor edema.  No joint effusions. Neurologic:  Normal speech and language. No gross focal neurologic deficits are appreciated. No gait instability. Skin:  Skin is warm, dry and intact. No rash noted. Psychiatric: Mood and affect are normal. Speech and behavior are normal.  ____________________________________________   LABS (all labs ordered are listed, but only abnormal results are displayed)  Labs Reviewed  COMPREHENSIVE METABOLIC PANEL - Abnormal; Notable for the following components:      Result Value   Glucose, Bld 162 (*)    All other components within normal limits  CBC - Abnormal;  Notable for the following components:   Hemoglobin 10.3 (*)    HCT 34.5 (*)    MCV 71.0 (*)    MCH 21.2 (*)    MCHC 29.9 (*)    RDW 17.2 (*)    All other components within normal limits  URINALYSIS, ROUTINE W REFLEX MICROSCOPIC - Abnormal; Notable for the following components:   Color, Urine YELLOW (*)    APPearance HAZY (*)    Hgb urine dipstick SMALL (*)    Ketones, ur 5 (*)    Protein, ur 30 (*)    Leukocytes,Ua TRACE (*)    All other components within normal limits  LIPASE, BLOOD   ____________________________________________  EKG   ____________________________________________  RADIOLOGY  ED MD interpretation: CT results were reviewed by me and read by radiologist.  Official radiology report(s): CT ABDOMEN PELVIS WO CONTRAST  Result Date: 03/28/2022 CLINICAL DATA:  Abdominal pain, nausea, known parasitic infection EXAM: CT ABDOMEN AND PELVIS WITHOUT CONTRAST TECHNIQUE: Multidetector CT imaging of the abdomen and pelvis was performed following the standard protocol without IV  contrast. Unenhanced CT was performed per clinician order. Lack of IV contrast limits sensitivity and specificity, especially for evaluation of abdominal/pelvic solid viscera. RADIATION DOSE REDUCTION: This exam was performed according to the departmental dose-optimization program which includes automated exposure control, adjustment of the mA and/or kV according to patient size and/or use of iterative reconstruction technique. COMPARISON:  None Available. FINDINGS: Lower chest: No acute pleural or parenchymal lung disease. Hepatobiliary: Unremarkable unenhanced appearance of the liver and gallbladder. No biliary duct dilation. Pancreas: Unremarkable unenhanced appearance. Spleen: Unremarkable unenhanced appearance. Adrenals/Urinary Tract: Bilateral nonobstructing renal calculi, measuring up to 5 mm on the right and 3 mm on the left. No hydronephrosis. Bladder is minimally distended, limiting its evaluation.  No gross abnormality. The adrenals are unremarkable. Stomach/Bowel: No bowel obstruction or ileus. Normal appendix right lower quadrant. No bowel wall thickening or inflammatory change. Vascular/Lymphatic: Aortic atherosclerosis. No enlarged abdominal or pelvic lymph nodes. Reproductive: Uterus and bilateral adnexa are unremarkable. Other: No free fluid or free intraperitoneal gas. No abdominal wall hernia. Musculoskeletal: No acute or destructive bony lesions. Reconstructed images demonstrate no additional findings. IMPRESSION: 1. Bilateral nonobstructing renal calculi. 2. Otherwise no acute intra-abdominal or intrapelvic process. 3.  Aortic Atherosclerosis (ICD10-I70.0). Electronically Signed   By: Randa Ngo M.D.   On: 03/28/2022 16:13    ____________________________________________   PROCEDURES  Procedure(s) performed: None  Procedures  Critical Care performed: No  ____________________________________________   INITIAL IMPRESSION / ASSESSMENT AND PLAN / ED COURSE     Kuuipo Anzaldo is a 66 y.o. Pennington presents to the emergency room for complaint of generalized abdominal pain/nausea. Patient reports that last week she was seen by her primary care provider after having abdominal bloating/nausea and noticing "parasites" in her stool.  She reports that she was tested for parasites and told that she had "intestinal parasites" and treated with a medication that she does not recall the name of.  She reports that she took 2 pills at 1 time.  Patient reports after taking these pills that her abdominal pain/nausea has resolved until approximately 2 days ago.  She reports that her primary care provider called to check on her Friday and she was not feeling better.  They had recommended that she have x-rays this upcoming week however her symptoms have gotten worse so she reported to the emergency room today Patient reports that over the last 2 days she has been experiencing abdominal  bloating/generalized abdominal pain/nausea. Patient reports that she is having loose stools after ingesting solid foods.  Patient also reports that she has been having some burning with urination. Patient reports that for approximately 3 days she had not seen any "white specks" in her stool.  However, the appearance of the "parasites" have returned. Patient denies urinary frequency/urgency/dysuria/hematuria. Patient denies vomiting/constipation. Patient denies fevers. Patient reports that she feels like there is something "gnawing at the inside of my bellybutton".  Patient also reports that she is having discharge from her bellybutton.  However, on exam there is no drainage from the bellybutton.  Patient reports there is a foul odor from her bellybutton.  However, on exam there is no foul odor noted from the bellybutton..   Patient has CBC that shows some mild anemia. She will need to be treated with iron supplementation and follow-up with her primary care provider for this.  Patient CMP is unremarkable. Patient urinalysis shows small amount of blood/ketones/leukocytes and 30+ protein.    Today I will order CT of abdomen and pelvis.  CT scan  today shows a 5 mm kidney stone on the right and a 3 mm kidney stone on the left, both are nonobstructing. Otherwise, there is no acute abdominal process at this time.  I have discussed CT findings with patient. While in the emergency room I will give her Zofran for nausea and Toradol for pain.  I will discharge her home with Zofran for nausea, Toradol for pain, Flomax for urinary flow. She will follow-up with her primary care provider concerning pinworms. Also recommend that she start taking an over-the-counter iron supplement daily and then follow-up with her primary care provider in regards to anemia.  We will discharge patient home in stable condition at this time.     ____________________________________________   FINAL CLINICAL IMPRESSION(S)  / ED DIAGNOSES  Final diagnoses:  Generalized abdominal pain  Acute bilateral back pain, unspecified back location  Kidney stone  Nausea     ED Discharge Orders          Ordered    ketorolac (TORADOL) 10 MG tablet  Every 6 hours PRN        03/28/22 1658    ondansetron (ZOFRAN-ODT) 4 MG disintegrating tablet  Every 8 hours PRN        03/28/22 1658    tamsulosin (FLOMAX) 0.4 MG CAPS capsule  Daily        03/28/22 1658             Note:  This document was prepared using Dragon voice recognition software and may include unintentional dictation errors.     Herschell Dimes, NP 03/28/22 1658    Pilar Jarvis, MD 03/29/22 1911

## 2022-03-28 NOTE — ED Triage Notes (Signed)
Patient reports being diagnosed with parasites a week ago by PCP stool sample. States she was given two pills by pcp for treatment.   Presents to ER today with abdominal pain, nausea, and reports unable to keep down food/liquid. Patient also states she is bloated and her belly button has been leaking a foul smelling fluid.  No leakage from belly button noted in triage at this time.   Reports she has a abdominal xray scheduled for Monday but could not wait due to pain

## 2022-03-28 NOTE — Discharge Instructions (Addendum)
You have been seen today in the emergency room for abdominal and back pain with associated nausea. You have been found to have kidney stones. I will be prescribing you a medication called Flomax that will help with urinary flow.  I will also be given you a prescription for Toradol which will help with the pain from the kidney stones.  I will also prescribe you Zofran for nausea.  You should also be sure that you are drinking at least 64 ounces of water a day. An incidental finding is that you are slightly anemic.  I recommend that you start taking an over-the-counter iron supplement daily and follow-up with your primary care provider about this.  Be sure that you drink an adequate amount of water a day and take a stool softener daily as iron can be constipating. You should also follow-up with your primary care provider concerning the pinworms.

## 2022-04-24 ENCOUNTER — Ambulatory Visit
Admission: RE | Admit: 2022-04-24 | Discharge: 2022-04-24 | Disposition: A | Discharge status: Home / Self Care | Payer: Self-pay | Source: Ambulatory Visit | Attending: Internal Medicine | Admitting: Internal Medicine

## 2022-04-24 DIAGNOSIS — Z1231 Encounter for screening mammogram for malignant neoplasm of breast: Secondary | ICD-10-CM

## 2022-04-29 ENCOUNTER — Other Ambulatory Visit: Payer: Self-pay | Admitting: Internal Medicine

## 2022-04-29 DIAGNOSIS — R928 Other abnormal and inconclusive findings on diagnostic imaging of breast: Secondary | ICD-10-CM

## 2022-05-11 ENCOUNTER — Ambulatory Visit
Admission: RE | Admit: 2022-05-11 | Discharge: 2022-05-11 | Disposition: A | Discharge status: Home / Self Care | Payer: Medicare HMO | Source: Ambulatory Visit | Attending: Internal Medicine | Admitting: Internal Medicine

## 2022-05-11 ENCOUNTER — Other Ambulatory Visit: Payer: Self-pay | Admitting: Internal Medicine

## 2022-05-11 DIAGNOSIS — R921 Mammographic calcification found on diagnostic imaging of breast: Secondary | ICD-10-CM

## 2022-05-11 DIAGNOSIS — R928 Other abnormal and inconclusive findings on diagnostic imaging of breast: Secondary | ICD-10-CM

## 2022-05-20 ENCOUNTER — Other Ambulatory Visit: Payer: Self-pay | Admitting: Internal Medicine

## 2022-05-20 DIAGNOSIS — R921 Mammographic calcification found on diagnostic imaging of breast: Secondary | ICD-10-CM

## 2022-05-25 ENCOUNTER — Ambulatory Visit
Admission: RE | Admit: 2022-05-25 | Discharge: 2022-05-25 | Disposition: A | Discharge status: Home / Self Care | Payer: Medicare HMO | Source: Ambulatory Visit | Attending: Internal Medicine | Admitting: Internal Medicine

## 2022-05-25 DIAGNOSIS — R921 Mammographic calcification found on diagnostic imaging of breast: Secondary | ICD-10-CM

## 2022-05-25 HISTORY — PX: BREAST BIOPSY: SHX20

## 2023-02-28 ENCOUNTER — Other Ambulatory Visit: Payer: Self-pay

## 2023-02-28 ENCOUNTER — Emergency Department
Admission: EM | Admit: 2023-02-28 | Discharge: 2023-02-28 | Disposition: A | Discharge status: Home / Self Care | Payer: Medicare HMO | Attending: Emergency Medicine | Admitting: Emergency Medicine

## 2023-02-28 DIAGNOSIS — E860 Dehydration: Secondary | ICD-10-CM | POA: Insufficient documentation

## 2023-02-28 DIAGNOSIS — U071 COVID-19: Secondary | ICD-10-CM | POA: Insufficient documentation

## 2023-02-28 DIAGNOSIS — R112 Nausea with vomiting, unspecified: Secondary | ICD-10-CM | POA: Diagnosis present

## 2023-02-28 LAB — CBC
HCT: 42.9 % (ref 36.0–46.0)
Hemoglobin: 14.9 g/dL (ref 12.0–15.0)
MCH: 30.2 pg (ref 26.0–34.0)
MCHC: 34.7 g/dL (ref 30.0–36.0)
MCV: 87 fL (ref 80.0–100.0)
Platelets: 336 10*3/uL (ref 150–400)
RBC: 4.93 MIL/uL (ref 3.87–5.11)
RDW: 12.9 % (ref 11.5–15.5)
WBC: 12.8 10*3/uL — ABNORMAL HIGH (ref 4.0–10.5)
nRBC: 0 % (ref 0.0–0.2)

## 2023-02-28 LAB — COMPREHENSIVE METABOLIC PANEL
ALT: 24 U/L (ref 0–44)
AST: 23 U/L (ref 15–41)
Albumin: 4.3 g/dL (ref 3.5–5.0)
Alkaline Phosphatase: 95 U/L (ref 38–126)
Anion gap: 13 (ref 5–15)
BUN: 10 mg/dL (ref 8–23)
CO2: 25 mmol/L (ref 22–32)
Calcium: 9.4 mg/dL (ref 8.9–10.3)
Chloride: 96 mmol/L — ABNORMAL LOW (ref 98–111)
Creatinine, Ser: 0.64 mg/dL (ref 0.44–1.00)
GFR, Estimated: >60 mL/min (ref >60)
Glucose, Bld: 128 mg/dL — ABNORMAL HIGH (ref 70–99)
Potassium: 3.5 mmol/L (ref 3.5–5.1)
Sodium: 134 mmol/L — ABNORMAL LOW (ref 135–145)
Total Bilirubin: 1 mg/dL (ref 0.3–1.2)
Total Protein: 8.1 g/dL (ref 6.5–8.1)

## 2023-02-28 LAB — URINALYSIS, ROUTINE W REFLEX MICROSCOPIC
Bilirubin Urine: NEGATIVE
Glucose, UA: NEGATIVE mg/dL
Ketones, ur: NEGATIVE mg/dL
Leukocytes,Ua: NEGATIVE
Nitrite: NEGATIVE
Protein, ur: NEGATIVE mg/dL
Specific Gravity, Urine: 1.016 (ref 1.005–1.030)
pH: 6 (ref 5.0–8.0)

## 2023-02-28 LAB — LIPASE, BLOOD: Lipase: 40 U/L (ref 11–51)

## 2023-02-28 MED ORDER — FAMOTIDINE IN NACL 20-0.9 MG/50ML-% IV SOLN
20.0000 mg | Freq: Once | INTRAVENOUS | Status: AC
  Administered 2023-02-28: 20 mg via INTRAVENOUS
  Filled 2023-02-28: qty 50

## 2023-02-28 MED ORDER — PANTOPRAZOLE SODIUM 40 MG IV SOLR
40.0000 mg | Freq: Once | INTRAVENOUS | Status: AC
  Administered 2023-02-28: 40 mg via INTRAVENOUS
  Filled 2023-02-28: qty 10

## 2023-02-28 MED ORDER — SUCRALFATE 1 G PO TABS: 1.0000 g | ORAL_TABLET | Freq: Four times a day (QID) | ORAL | 1 refills | Status: AC

## 2023-02-28 MED ORDER — OMEPRAZOLE MAGNESIUM 20 MG PO TBEC: 20.0000 mg | DELAYED_RELEASE_TABLET | Freq: Every day | ORAL | 0 refills | Status: AC

## 2023-02-28 MED ORDER — KETOROLAC TROMETHAMINE 15 MG/ML IJ SOLN
15.0000 mg | Freq: Once | INTRAMUSCULAR | Status: AC
  Administered 2023-02-28: 15 mg via INTRAVENOUS
  Filled 2023-02-28: qty 1

## 2023-02-28 MED ORDER — SODIUM CHLORIDE 0.9 % IV BOLUS
1000.0000 mL | Freq: Once | INTRAVENOUS | Status: AC
  Administered 2023-02-28: 1000 mL via INTRAVENOUS

## 2023-02-28 MED ORDER — ONDANSETRON 4 MG PO TBDP: 4.0000 mg | ORAL_TABLET | Freq: Three times a day (TID) | ORAL | 0 refills | Status: DC | PRN

## 2023-02-28 MED ORDER — DICYCLOMINE HCL 20 MG PO TABS: 20.0000 mg | ORAL_TABLET | Freq: Four times a day (QID) | ORAL | 0 refills | Status: AC | PRN

## 2023-02-28 MED ORDER — FAMOTIDINE 20 MG PO TABS: 20.0000 mg | ORAL_TABLET | Freq: Two times a day (BID) | ORAL | 0 refills | Status: AC

## 2023-02-28 MED ORDER — DICYCLOMINE HCL 10 MG/ML IM SOLN
20.0000 mg | Freq: Once | INTRAMUSCULAR | Status: AC
  Administered 2023-02-28: 20 mg via INTRAMUSCULAR
  Filled 2023-02-28 (×3): qty 2

## 2023-02-28 NOTE — ED Provider Notes (Signed)
-----------------------------------------   3:23 PM on 02/28/2023 -----------------------------------------  Blood pressure (!) 135/93, pulse (!) 102, temperature 98.3 F (36.8 C), temperature source Oral, resp. rate 18, height 5\' 3"  (1.6 m), weight 63.5 kg, SpO2 98%.  Assuming care from Dr. Scotty Court.  In short, Lauren Pennington is a 67 y.o. adult with a chief complaint of Abdominal Pain, Emesis, and Diarrhea .  Refer to the original H&P for additional details.  The current plan of care is to reassess following symptomatic treatment for COVID and GI symptoms.  ----------------------------------------- 5:17 PM on 02/28/2023 ----------------------------------------- Patient reports feeling much better following symptomatic treatment, is now tolerating oral intake without difficulty.  Given reassuring workup, she is appropriate for discharge home with PCP follow-up.  She was counseled to return to the ED for new or worsening symptoms.  Patient agrees with plan.    Chesley Noon, MD 02/28/23 [phone]

## 2023-02-28 NOTE — ED Triage Notes (Signed)
Pt to ED POV, teary in triage, diagnosed with covid at PCP 4 days ago. States about 2 days before this, developed abdominal cramping, diarrhea and vomiting.  4 episodes diarrhea today, not vomiting, only dry heaves. Diarrhea is watery and "black". Pt has chills and hot flashes.   Pt in NAD.

## 2023-02-28 NOTE — ED Provider Notes (Signed)
[facility] Provider Note    Event Date/Time   First MD Initiated Contact with Patient 02/28/23 1313     (approximate)   History   Chief Complaint: Abdominal Pain, Emesis, and Diarrhea   HPI  Lauren Pennington is a 67 y.o. adult recently diagnosed with COVID by PCP 4 days ago who comes ED complaining of abdominal cramping, nausea, vomiting, diarrhea over the past 2 to 3 days.  Diarrhea is watery.  Not bloody.  No hematemesis.  No chest pain or shortness of breath.  Has had poor oral intake due to the symptoms.     Physical Exam   Triage Vital Signs: ED Triage Vitals  Encounter Vitals Group     BP 02/28/23 1143 (!) 135/93     Systolic BP Percentile --      Diastolic BP Percentile --      Pulse Rate 02/28/23 1143 (!) 102     Resp 02/28/23 1143 18     Temp 02/28/23 1143 98.3 F (36.8 C)     Temp Source 02/28/23 1143 Oral     SpO2 02/28/23 1143 98 %     Weight 02/28/23 1144 140 lb (63.5 kg)     Height 02/28/23 1144 5\' 3"  (1.6 m)     Head Circumference --      Peak Flow --      Pain Score --      Pain Loc --      Pain Education --      Exclude from Growth Chart --     Most recent vital signs: Vitals:   02/28/23 1143  BP: (!) 135/93  Pulse: (!) 102  Resp: 18  Temp: 98.3 F (36.8 C)  SpO2: 98%    General: Awake, no distress.  CV:  Good peripheral perfusion.  Tachycardia heart rate 100 Resp:  Normal effort.  Clear to auscultation bilaterally Abd:  No distention.  Soft nontender Other:  Dry oral mucosa.  No rash.   ED Results / Procedures / Treatments   Labs (all labs ordered are listed, but only abnormal results are displayed) Labs Reviewed  COMPREHENSIVE METABOLIC PANEL - Abnormal; Notable for the following components:      Result Value   Sodium 134 (*)    Chloride 96 (*)    Glucose, Bld 128 (*)    All other components within normal limits  CBC - Abnormal; Notable for the following components:   WBC 12.8 (*)    All other  components within normal limits  URINALYSIS, ROUTINE W REFLEX MICROSCOPIC - Abnormal; Notable for the following components:   Color, Urine YELLOW (*)    APPearance CLEAR (*)    Hgb urine dipstick MODERATE (*)    Bacteria, UA RARE (*)    All other components within normal limits  LIPASE, BLOOD     EKG Interpreted by me Normal sinus rhythm rate of 89.  Normal axis and intervals.  Normal QRS ST segments and T waves.   RADIOLOGY    PROCEDURES:  Procedures   MEDICATIONS ORDERED IN ED: Medications  sodium chloride 0.9 % bolus 1,000 mL (has no administration in time range)  ketorolac (TORADOL) 15 MG/ML injection 15 mg (has no administration in time range)  pantoprazole (PROTONIX) injection 40 mg (has no administration in time range)  famotidine (PEPCID) IVPB 20 mg premix (has no administration in time range)  dicyclomine (BENTYL) injection 20 mg (has no administration in time range)     IMPRESSION /  MDM / ASSESSMENT AND PLAN / ED COURSE  I reviewed the triage vital signs and the nursing notes.  DDx: Dehydration, electrolyte abnormality, anemia, AKI, viral syndrome, UTI  Patient's presentation is most consistent with acute presentation with potential threat to life or bodily function.  Patient presents with symptoms of gastroenteritis in the setting of recent COVID diagnosis.  She appears clinically dehydrated although vital signs are unremarkable except for mild tachycardia.  Labs are reassuring.  Will give IV fluids and antacids for symptom relief.  If tolerating oral intake she can be discharged home.       FINAL CLINICAL IMPRESSION(S) / ED DIAGNOSES   Final diagnoses:  COVID-19 virus infection  Dehydration     Rx / DC Orders   ED Discharge Orders          Ordered    dicyclomine (BENTYL) 20 MG tablet  Every 6 hours PRN        02/28/23 1429    sucralfate (CARAFATE) 1 g tablet  4 times daily        02/28/23 1429    famotidine (PEPCID) 20 MG tablet  2 times  daily        02/28/23 1429    omeprazole (PRILOSEC OTC) 20 MG tablet  Daily        02/28/23 1429             Note:  This document was prepared using Dragon voice recognition software and may include unintentional dictation errors.   Sharman Cheek, MD 02/28/23 1434

## 2023-05-13 ENCOUNTER — Other Ambulatory Visit: Payer: Self-pay | Admitting: Internal Medicine

## 2023-05-13 DIAGNOSIS — M19011 Primary osteoarthritis, right shoulder: Secondary | ICD-10-CM

## 2023-05-13 DIAGNOSIS — Z13820 Encounter for screening for osteoporosis: Secondary | ICD-10-CM

## 2023-05-13 DIAGNOSIS — M25519 Pain in unspecified shoulder: Secondary | ICD-10-CM

## 2023-05-14 ENCOUNTER — Other Ambulatory Visit: Payer: Self-pay | Admitting: Internal Medicine

## 2023-05-14 DIAGNOSIS — Z1231 Encounter for screening mammogram for malignant neoplasm of breast: Secondary | ICD-10-CM

## 2023-05-21 ENCOUNTER — Ambulatory Visit
Admission: RE | Admit: 2023-05-21 | Discharge: 2023-05-21 | Disposition: A | Discharge status: Home / Self Care | Payer: Medicare HMO | Source: Ambulatory Visit | Attending: Internal Medicine | Admitting: Internal Medicine

## 2023-05-21 DIAGNOSIS — M25519 Pain in unspecified shoulder: Secondary | ICD-10-CM | POA: Insufficient documentation

## 2023-05-21 DIAGNOSIS — M19011 Primary osteoarthritis, right shoulder: Secondary | ICD-10-CM | POA: Insufficient documentation

## 2023-05-30 ENCOUNTER — Emergency Department
Admission: EM | Admit: 2023-05-30 | Discharge: 2023-05-30 | Disposition: A | Discharge status: Home / Self Care | Payer: Medicare HMO | Attending: Emergency Medicine | Admitting: Emergency Medicine

## 2023-05-30 ENCOUNTER — Encounter: Payer: Self-pay | Admitting: Intensive Care

## 2023-05-30 ENCOUNTER — Other Ambulatory Visit: Payer: Self-pay

## 2023-05-30 ENCOUNTER — Emergency Department: Payer: Medicare HMO

## 2023-05-30 DIAGNOSIS — M25562 Pain in left knee: Secondary | ICD-10-CM

## 2023-05-30 DIAGNOSIS — M1712 Unilateral primary osteoarthritis, left knee: Secondary | ICD-10-CM

## 2023-05-30 MED ORDER — NAPROXEN 500 MG PO TABS: 500.0000 mg | ORAL_TABLET | Freq: Two times a day (BID) | ORAL | 0 refills | Status: AC

## 2023-05-30 MED ORDER — KETOROLAC TROMETHAMINE 15 MG/ML IJ SOLN
15.0000 mg | Freq: Once | INTRAMUSCULAR | Status: AC
  Administered 2023-05-30: 15 mg via INTRAMUSCULAR
  Filled 2023-05-30: qty 1

## 2023-05-30 NOTE — Discharge Instructions (Signed)
Please follow-up with the orthopedic department.  You may take the medication as prescribed to help with your pain.  You may wear the brace during the day, please remove it at night.  Please return for any new, worsening, or change in symptoms or other concerns.

## 2023-05-30 NOTE — ED Triage Notes (Signed)
Patient c/o left knee pain. Reports hard to even lift. States she was laying in bed when pain started and makes a popping noise. Denies injury and previous knee problems

## 2023-05-30 NOTE — ED Provider Notes (Signed)
[facility] Provider Note    Event Date/Time   First MD Initiated Contact with Patient 05/30/23 1049     (approximate)   History   Knee Pain   HPI  Lauren Pennington is a 67 y.o. adult who presents today for evaluation of 2 days of left knee pain.  Patient reports that her pain is on the medial aspect of her left knee.  She first noticed it when she got up from standing after crocheting for several hours.  She denies any fall to the ground.  She reports that she is continues to feel occasional popping.  She has not noticed any swelling, redness, warmth.  She has pain with ambulating but is able to ambulate.  No fevers or chills.  No previous knee surgeries.  There are no problems to display for this patient.         Physical Exam   Triage Vital Signs: ED Triage Vitals  Encounter Vitals Group     BP 05/30/23 1041 128/77     Systolic BP Percentile --      Diastolic BP Percentile --      Pulse Rate 05/30/23 1041 72     Resp 05/30/23 1041 16     Temp 05/30/23 1041 98.6 F (37 C)     Temp Source 05/30/23 1041 Oral     SpO2 05/30/23 1041 100 %     Weight 05/30/23 1042 135 lb (61.2 kg)     Height 05/30/23 1042 5\' 4"  (1.626 m)     Head Circumference --      Peak Flow --      Pain Score 05/30/23 1042 10     Pain Loc --      Pain Education --      Exclude from Growth Chart --     Most recent vital signs: Vitals:   05/30/23 1041  BP: 128/77  Pulse: 72  Resp: 16  Temp: 98.6 F (37 C)  SpO2: 100%    Physical Exam Vitals and nursing note reviewed.  Constitutional:      General: Awake and alert. No acute distress.    Appearance: Normal appearance. The patient is normal weight.  HENT:     Head: Normocephalic and atraumatic.     Mouth: Mucous membranes are moist.  Eyes:     General: PERRL. Normal EOMs        Right eye: No discharge.        Left eye: No discharge.     Conjunctiva/sclera: Conjunctivae normal.  Cardiovascular:     Rate and  Rhythm: Normal rate and regular rhythm.     Pulses: Normal pulses.  Pulmonary:     Effort: Pulmonary effort is normal. No respiratory distress.     Breath sounds: Normal breath sounds.  Abdominal:     Abdomen is soft. There is no abdominal tenderness. No rebound or guarding. No distention. Musculoskeletal:        General: No swelling. Normal range of motion.     Cervical back: Normal range of motion and neck supple.  Left knee: No deformity or rash.  Medial joint line tenderness. No patellar tenderness, no ballotment Warm and well perfused extremity with 2+ pedal pulses 5/5 strength to dorsiflexion and plantarflexion at the ankle with intact sensation throughout extremity Normal range of motion of the knee, with intact flexion and extension to active and passive range of motion. Extensor mechanism intact. No ligamentous laxity. Negative anterior/posterior drawer/negative lachman, pain  with mcmurrays.  Pain with varus and valgus stress though no laxity. No effusion or warmth Intact quadriceps, hamstring function, patellar tendon function Pelvis stable Full ROM of ankle without pain or swelling Foot warm and well perfused Skin:    General: Skin is warm and dry.     Capillary Refill: Capillary refill takes less than 2 seconds.     Findings: No rash.  Neurological:     Mental Status: The patient is awake and alert.      ED Results / Procedures / Treatments   Labs (all labs ordered are listed, but only abnormal results are displayed) Labs Reviewed - No data to display   EKG     RADIOLOGY I independently reviewed and interpreted imaging and agree with radiologists findings.     PROCEDURES:  Critical Care performed:   Procedures   MEDICATIONS ORDERED IN ED: Medications  ketorolac (TORADOL) 15 MG/ML injection 15 mg (15 mg Intramuscular Given 05/30/23 1150)     IMPRESSION / MDM / ASSESSMENT AND PLAN / ED COURSE  I reviewed the triage vital signs and the nursing  notes.   Differential diagnosis includes, but is not limited to, osteoarthritis, meniscus injury, ligamental injury, effusion.  Patient is awake and alert, hemodynamically stable and afebrile.  She is neurovascularly intact.  She is able to range her knee fully, there is no warmth, erythema, or effusion, or constitutional symptoms to suggest septic arthritis.  No evidence of neurological deficit or vascular compromise on exam, she has 2+ pedal pulses, normal capillary refill, feet are well-perfused and warm bilaterally. No fracture/dislocation on X-Ray. No deformity or obvious ligamentous laxity on exam. No constitutional symptoms or effusion to suggest septic joint. No history of immunosuppression. Overall well appearing, vital signs stable. No indication for diagnostic or therapeutic procedure such as arthrocentesis.  X-ray reveals medial compartment narrowing consistent with medial compartment osteoarthritis which is consistent with her exam.  Question of concurrent meniscus injury given her clicking/popping and her pain with McMurray's maneuver.  I recommended that she follow-up with orthopedics.  She was given an Ace wrap for extra support.  She was prescribed naproxen for pain.  Return precautions and care instructions discussed. Outpatient follow-up advised. Patient agrees with plan of care.    Patient's presentation is most consistent with acute complicated illness / injury requiring diagnostic workup.       FINAL CLINICAL IMPRESSION(S) / ED DIAGNOSES   Final diagnoses:  Acute pain of left knee  Osteoarthritis of left knee, unspecified osteoarthritis type     Rx / DC Orders   ED Discharge Orders          Ordered    naproxen (NAPROSYN) 500 MG tablet  2 times daily with meals        05/30/23 1140             Note:  This document was prepared using Dragon voice recognition software and may include unintentional dictation errors.   Keturah Shavers 05/30/23 1359     Jene Every, MD 05/30/23 [phone]

## 2023-06-16 ENCOUNTER — Inpatient Hospital Stay: Admission: RE | Admit: 2023-06-16 | Payer: Medicare HMO | Source: Ambulatory Visit

## 2023-06-16 ENCOUNTER — Other Ambulatory Visit: Payer: Medicare HMO

## 2023-07-17 ENCOUNTER — Emergency Department
Admission: EM | Admit: 2023-07-17 | Discharge: 2023-07-17 | Disposition: A | Discharge status: Home / Self Care | Payer: 59 | Attending: Emergency Medicine | Admitting: Emergency Medicine

## 2023-07-17 ENCOUNTER — Emergency Department: Payer: 59

## 2023-07-17 ENCOUNTER — Other Ambulatory Visit: Payer: Self-pay

## 2023-07-17 ENCOUNTER — Encounter: Payer: Self-pay | Admitting: Emergency Medicine

## 2023-07-17 DIAGNOSIS — R109 Unspecified abdominal pain: Secondary | ICD-10-CM | POA: Diagnosis present

## 2023-07-17 DIAGNOSIS — N200 Calculus of kidney: Secondary | ICD-10-CM

## 2023-07-17 DIAGNOSIS — N132 Hydronephrosis with renal and ureteral calculous obstruction: Secondary | ICD-10-CM | POA: Diagnosis not present

## 2023-07-17 DIAGNOSIS — N201 Calculus of ureter: Secondary | ICD-10-CM

## 2023-07-17 LAB — CBC
HCT: 41.8 % (ref 36.0–46.0)
Hemoglobin: 14.1 g/dL (ref 12.0–15.0)
MCH: 30.5 pg (ref 26.0–34.0)
MCHC: 33.7 g/dL (ref 30.0–36.0)
MCV: 90.3 fL (ref 80.0–100.0)
Platelets: 279 10*3/uL (ref 150–400)
RBC: 4.63 MIL/uL (ref 3.87–5.11)
RDW: 13.6 % (ref 11.5–15.5)
WBC: 6.1 10*3/uL (ref 4.0–10.5)
nRBC: 0 % (ref 0.0–0.2)

## 2023-07-17 LAB — COMPREHENSIVE METABOLIC PANEL
ALT: 19 U/L (ref 0–44)
AST: 25 U/L (ref 15–41)
Albumin: 4.2 g/dL (ref 3.5–5.0)
Alkaline Phosphatase: 80 U/L (ref 38–126)
Anion gap: 10 (ref 5–15)
BUN: 17 mg/dL (ref 8–23)
CO2: 23 mmol/L (ref 22–32)
Calcium: 9.3 mg/dL (ref 8.9–10.3)
Chloride: 104 mmol/L (ref 98–111)
Creatinine, Ser: 0.58 mg/dL (ref 0.44–1.00)
GFR, Estimated: >60 mL/min (ref >60)
Glucose, Bld: 140 mg/dL — ABNORMAL HIGH (ref 70–99)
Potassium: 4.1 mmol/L (ref 3.5–5.1)
Sodium: 137 mmol/L (ref 135–145)
Total Bilirubin: 0.7 mg/dL (ref 0.0–1.2)
Total Protein: 7.1 g/dL (ref 6.5–8.1)

## 2023-07-17 LAB — URINALYSIS, ROUTINE W REFLEX MICROSCOPIC
Bacteria, UA: NONE SEEN
Bilirubin Urine: NEGATIVE
Glucose, UA: NEGATIVE mg/dL
Ketones, ur: NEGATIVE mg/dL
Nitrite: NEGATIVE
Protein, ur: 30 mg/dL — AB
RBC / HPF: >50 RBC/hpf (ref 0–5)
Specific Gravity, Urine: 1.014 (ref 1.005–1.030)
Squamous Epithelial / HPF: 0 /[HPF] (ref 0–5)
WBC, UA: >50 WBC/hpf (ref 0–5)
pH: 6 (ref 5.0–8.0)

## 2023-07-17 LAB — LIPASE, BLOOD: Lipase: 40 U/L (ref 11–51)

## 2023-07-17 MED ORDER — TAMSULOSIN HCL 0.4 MG PO CAPS: 0.4000 mg | ORAL_CAPSULE | Freq: Every day | ORAL | 0 refills | Status: AC

## 2023-07-17 MED ORDER — TAMSULOSIN HCL 0.4 MG PO CAPS: 0.8000 mg | ORAL_CAPSULE | Freq: Once | ORAL | Status: DC

## 2023-07-17 MED ORDER — ONDANSETRON 8 MG PO TBDP: 8.0000 mg | ORAL_TABLET | Freq: Three times a day (TID) | ORAL | Status: DC | PRN

## 2023-07-17 MED ORDER — KETOROLAC TROMETHAMINE 10 MG PO TABS
10.0000 mg | ORAL_TABLET | Freq: Once | ORAL | Status: AC
  Administered 2023-07-17: 10 mg via ORAL
  Filled 2023-07-17: qty 1

## 2023-07-17 MED ORDER — HYDROCODONE-ACETAMINOPHEN 5-325 MG PO TABS
1.0000 | ORAL_TABLET | Freq: Once | ORAL | Status: AC
  Administered 2023-07-17: 1 via ORAL
  Filled 2023-07-17: qty 1

## 2023-07-17 MED ORDER — KETOROLAC TROMETHAMINE 10 MG PO TABS: 10.0000 mg | ORAL_TABLET | Freq: Four times a day (QID) | ORAL | 0 refills | Status: AC | PRN

## 2023-07-17 MED ORDER — KETOROLAC TROMETHAMINE 10 MG PO TABS
10.0000 mg | ORAL_TABLET | Freq: Once | ORAL | Status: DC
  Filled 2023-07-17: qty 1

## 2023-07-17 NOTE — ED Triage Notes (Signed)
 Patient complaining of lower abdominal pain and lower back pain. Patient was sent here from urgent care for kidney stones and positive UTI.

## 2023-07-17 NOTE — ED Provider Notes (Signed)
 [facility] Provider Note   Event Date/Time   First MD Initiated Contact with Patient 07/17/23 0900     (approximate) History  Abdominal Pain  HPI Lauren Pennington is a 68 y.o. adult with a stated past medical history of kidney stones who presents complaining of right flank pain over the last 3 days that is 10/10 in severity and radiating into the lower pelvic region on the right.  Patient states she was seen by urgent care yesterday and diagnosed with a likely kidney stone, placed on Toradol , Bactrim, and told to come to the emergency department for a CT scan if symptoms worsen that she may have an infected kidney stone.  Patient states she is not gotten this medication yet and therefore has not had any control of her symptoms. ROS: Patient currently denies any vision changes, tinnitus, difficulty speaking, facial droop, sore throat, chest pain, shortness of breath, nausea/vomiting/diarrhea, dysuria, or weakness/numbness/paresthesias in any extremity   Physical Exam  Triage Vital Signs: ED Triage Vitals  Encounter Vitals Group     BP 07/17/23 0602 127/83     Systolic BP Percentile --      Diastolic BP Percentile --      Pulse Rate 07/17/23 0602 76     Resp 07/17/23 0602 20     Temp 07/17/23 0602 98.1 F (36.7 C)     Temp Source 07/17/23 0602 Oral     SpO2 07/17/23 0602 100 %     Weight 07/17/23 0602 136 lb (61.7 kg)     Height 07/17/23 0602 5' 4 (1.626 m)     Head Circumference --      Peak Flow --      Pain Score 07/17/23 0601 10     Pain Loc --      Pain Education --      Exclude from Growth Chart --    Most recent vital signs: Vitals:   07/17/23 0602  BP: 127/83  Pulse: 76  Resp: 20  Temp: 98.1 F (36.7 C)  SpO2: 100%   General: Awake, oriented x4. CV:  Good peripheral perfusion.  Resp:  Normal effort.  Abd:  No distention.  Other:  Elderly well-developed, well-nourished Hispanic female resting comfortably in no acute distress ED Results /  Procedures / Treatments  Labs (all labs ordered are listed, but only abnormal results are displayed) Labs Reviewed  COMPREHENSIVE METABOLIC PANEL - Abnormal; Notable for the following components:      Result Value   Glucose, Bld 140 (*)    All other components within normal limits  URINALYSIS, ROUTINE W REFLEX MICROSCOPIC - Abnormal; Notable for the following components:   Color, Urine YELLOW (*)    APPearance HAZY (*)    Hgb urine dipstick LARGE (*)    Protein, ur 30 (*)    Leukocytes,Ua TRACE (*)    All other components within normal limits  URINE CULTURE  LIPASE, BLOOD  CBC   RADIOLOGY ED MD interpretation: CT noncontrast of the abdomen and pelvis interpreted independently and shows a mild right hydronephrosis and hydroureter from a 5 mm distal ureteral calculus.  There is bilateral nephrolithiasis -Agree with radiology assessment Official radiology report(s): CT Renal Stone Study Result Date: 07/17/2023 CLINICAL DATA:  Abdominal and flank pain with stone suspected. Lower abdominal pain and lower back pain. EXAM: CT ABDOMEN AND PELVIS WITHOUT CONTRAST TECHNIQUE: Multidetector CT imaging of the abdomen and pelvis was performed following the standard protocol without IV contrast. RADIATION DOSE  REDUCTION: This exam was performed according to the departmental dose-optimization program which includes automated exposure control, adjustment of the mA and/or kV according to patient size and/or use of iterative reconstruction technique. COMPARISON:  03/28/2022 FINDINGS: Lower chest:  Coronary atherosclerosis at the LAD. Hepatobiliary: No focal liver abnormality.No evidence of biliary obstruction or stone. Pancreas: Unremarkable. Spleen: Unremarkable. Adrenals/Urinary Tract: Negative adrenals. 5 mm stone at the lower right ureter, below the iliac crossing, with mild right hydroureteronephrosis. Three small calculi in the right kidney measuring up to 3 mm at the lower pole. Two small left renal  calculi. Unremarkable bladder. Stomach/Bowel:  No obstruction. No visible bowel inflammation. Vascular/Lymphatic: No acute vascular abnormality. No mass or adenopathy. Reproductive:No pathologic findings. Other: No ascites or pneumoperitoneum. Musculoskeletal: No acute abnormalities. IMPRESSION: 1. Mild right hydronephrosis and hydroureter from a 5 mm distal ureteral calculus. 2. Bilateral nephrolithiasis. 3. Atherosclerosis including the coronary arteries. Electronically Signed   By: Dorn Roulette M.D.   On: 07/17/2023 07:05   PROCEDURES: Critical Care performed: No Procedures MEDICATIONS ORDERED IN ED: Medications  tamsulosin  (FLOMAX ) capsule 0.8 mg (has no administration in time range)  ondansetron  (ZOFRAN -ODT) disintegrating tablet 8 mg (has no administration in time range)  HYDROcodone -acetaminophen  (NORCO/VICODIN) 5-325 MG per tablet 1 tablet (1 tablet Oral Given 07/17/23 0919)  ketorolac  (TORADOL ) tablet 10 mg (10 mg Oral Given 07/17/23 0930)   IMPRESSION / MDM / ASSESSMENT AND PLAN / ED COURSE  I reviewed the triage vital signs and the nursing notes.                             The patient is on the cardiac monitor to evaluate for evidence of arrhythmia and/or significant heart rate changes. Patient's presentation is most consistent with acute presentation with potential threat to life or bodily function. Patient presents for severe flank pain. Presentation most consistent with Renal Colic from a Non-infected Kidney Stone. Given History and Exam I have lower suspicion for atypical appendicitis, genital torsion, acute cholecystitis, AAA, Aortic Dissection, Serious Bacterial Illness or other emergent intraabdominal pathology.  Workup: CBC, BMP, CT Abd/Pelvis noncontrast, UA, reassess Findings: 5 mm right distal ureteral stone  UA does not show any evidence of infection at this time however patient encouraged to finish her prescribed antibiotic course Reassesment: Patient tolerating PO  and pain controlled Disposition:  Discharge. Strict return precautions for infected stone or PO intolerance discussed.   FINAL CLINICAL IMPRESSION(S) / ED DIAGNOSES   Final diagnoses:  Acute right flank pain  Kidney stone  Ureterolithiasis   Rx / DC Orders   ED Discharge Orders          Ordered    tamsulosin  (FLOMAX ) 0.4 MG CAPS capsule  Daily        07/17/23 0934    ketorolac  (TORADOL ) 10 MG tablet  Every 6 hours PRN       Note to Pharmacy: Patient given an IM/IV loading dose in emergency department   07/17/23 0934           Note:  This document was prepared using Dragon voice recognition software and may include unintentional dictation errors.   Delle Andrzejewski K, MD 07/17/23 [phone]

## 2023-07-18 LAB — URINE CULTURE: Culture: NO GROWTH

## 2024-02-07 ENCOUNTER — Other Ambulatory Visit: Payer: Self-pay | Admitting: Internal Medicine

## 2024-02-07 DIAGNOSIS — R1903 Right lower quadrant abdominal swelling, mass and lump: Secondary | ICD-10-CM

## 2024-02-07 DIAGNOSIS — R1900 Intra-abdominal and pelvic swelling, mass and lump, unspecified site: Secondary | ICD-10-CM
